# Patient Record
Sex: Female | Born: 1960
Health system: Southern US, Community
[De-identification: ages and names within clinical notes are randomized; demographics above are authoritative.]

## PROBLEM LIST (undated history)

## (undated) DIAGNOSIS — K589 Irritable bowel syndrome without diarrhea: Secondary | ICD-10-CM

## (undated) DIAGNOSIS — J309 Allergic rhinitis, unspecified: Secondary | ICD-10-CM

## (undated) DIAGNOSIS — I10 Essential (primary) hypertension: Secondary | ICD-10-CM

## (undated) DIAGNOSIS — C439 Malignant melanoma of skin, unspecified: Secondary | ICD-10-CM

## (undated) DIAGNOSIS — E785 Hyperlipidemia, unspecified: Secondary | ICD-10-CM

## (undated) DIAGNOSIS — F988 Other specified behavioral and emotional disorders with onset usually occurring in childhood and adolescence: Secondary | ICD-10-CM

## (undated) HISTORY — DX: Essential (primary) hypertension: I10

## (undated) HISTORY — DX: Other specified behavioral and emotional disorders with onset usually occurring in childhood and adolescence: F98.8

## (undated) HISTORY — DX: Hyperlipidemia, unspecified: E78.5

## (undated) HISTORY — DX: Irritable bowel syndrome, unspecified: K58.9

## (undated) HISTORY — PX: COLPOSCOPY: SHX161

## (undated) HISTORY — DX: Allergic rhinitis, unspecified: J30.9

## (undated) HISTORY — DX: Malignant melanoma of skin, unspecified: C43.9

---
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
## 1993-10-25 DIAGNOSIS — C439 Malignant melanoma of skin, unspecified: Secondary | ICD-10-CM

## 1993-10-25 HISTORY — DX: Malignant melanoma of skin, unspecified: C43.9

## 1998-01-16 ENCOUNTER — Ambulatory Visit (HOSPITAL_COMMUNITY): Admission: RE | Admit: 1998-01-16 | Discharge: 1998-01-16 | Payer: Self-pay | Admitting: Obstetrics and Gynecology

## 1998-07-21 ENCOUNTER — Inpatient Hospital Stay (HOSPITAL_COMMUNITY): Admission: AD | Admit: 1998-07-21 | Discharge: 1998-07-23 | Payer: Self-pay | Admitting: Obstetrics and Gynecology

## 1998-07-28 ENCOUNTER — Encounter (HOSPITAL_COMMUNITY): Admission: RE | Admit: 1998-07-28 | Discharge: 1998-10-26 | Payer: Self-pay | Admitting: Obstetrics and Gynecology

## 1998-10-30 ENCOUNTER — Encounter (HOSPITAL_COMMUNITY): Admission: RE | Admit: 1998-10-30 | Discharge: 1999-01-28 | Payer: Self-pay | Admitting: *Deleted

## 1999-02-06 ENCOUNTER — Ambulatory Visit (HOSPITAL_COMMUNITY): Admission: RE | Admit: 1999-02-06 | Discharge: 1999-02-06 | Payer: Self-pay | Admitting: Internal Medicine

## 1999-02-06 ENCOUNTER — Encounter: Payer: Self-pay | Admitting: Internal Medicine

## 1999-02-24 ENCOUNTER — Other Ambulatory Visit: Admission: RE | Admit: 1999-02-24 | Discharge: 1999-02-24 | Payer: Self-pay | Admitting: Obstetrics and Gynecology

## 1999-02-28 ENCOUNTER — Encounter (HOSPITAL_COMMUNITY): Admission: RE | Admit: 1999-02-28 | Discharge: 1999-05-29 | Payer: Self-pay | Admitting: *Deleted

## 1999-06-03 ENCOUNTER — Encounter (HOSPITAL_COMMUNITY): Admission: RE | Admit: 1999-06-03 | Discharge: 1999-09-01 | Payer: Self-pay | Admitting: *Deleted

## 2000-05-10 ENCOUNTER — Other Ambulatory Visit: Admission: RE | Admit: 2000-05-10 | Discharge: 2000-05-10 | Payer: Self-pay | Admitting: Obstetrics and Gynecology

## 2001-06-09 ENCOUNTER — Other Ambulatory Visit: Admission: RE | Admit: 2001-06-09 | Discharge: 2001-06-09 | Payer: Self-pay | Admitting: Obstetrics and Gynecology

## 2001-06-14 ENCOUNTER — Encounter: Admission: RE | Admit: 2001-06-14 | Discharge: 2001-06-14 | Payer: Self-pay | Admitting: Obstetrics and Gynecology

## 2001-06-14 ENCOUNTER — Encounter: Payer: Self-pay | Admitting: Obstetrics and Gynecology

## 2002-06-11 ENCOUNTER — Other Ambulatory Visit: Admission: RE | Admit: 2002-06-11 | Discharge: 2002-06-11 | Payer: Self-pay | Admitting: Obstetrics and Gynecology

## 2002-06-18 ENCOUNTER — Encounter: Payer: Self-pay | Admitting: Obstetrics and Gynecology

## 2002-06-18 ENCOUNTER — Encounter: Admission: RE | Admit: 2002-06-18 | Discharge: 2002-06-18 | Payer: Self-pay | Admitting: Obstetrics and Gynecology

## 2003-06-14 ENCOUNTER — Encounter: Payer: Self-pay | Admitting: Internal Medicine

## 2003-06-14 ENCOUNTER — Ambulatory Visit (HOSPITAL_COMMUNITY): Admission: RE | Admit: 2003-06-14 | Discharge: 2003-06-14 | Payer: Self-pay | Admitting: Internal Medicine

## 2003-06-17 ENCOUNTER — Other Ambulatory Visit: Admission: RE | Admit: 2003-06-17 | Discharge: 2003-06-17 | Payer: Self-pay | Admitting: Obstetrics and Gynecology

## 2003-09-24 ENCOUNTER — Encounter: Admission: RE | Admit: 2003-09-24 | Discharge: 2003-09-24 | Payer: Self-pay | Admitting: Obstetrics and Gynecology

## 2004-12-30 ENCOUNTER — Other Ambulatory Visit: Admission: RE | Admit: 2004-12-30 | Discharge: 2004-12-30 | Payer: Self-pay | Admitting: Obstetrics and Gynecology

## 2005-03-05 ENCOUNTER — Ambulatory Visit: Payer: Self-pay | Admitting: Internal Medicine

## 2005-03-09 ENCOUNTER — Encounter: Admission: RE | Admit: 2005-03-09 | Discharge: 2005-03-09 | Payer: Self-pay | Admitting: Obstetrics and Gynecology

## 2005-09-08 ENCOUNTER — Ambulatory Visit: Payer: Self-pay | Admitting: Internal Medicine

## 2005-11-20 ENCOUNTER — Ambulatory Visit: Payer: Self-pay | Admitting: Internal Medicine

## 2005-12-31 ENCOUNTER — Other Ambulatory Visit: Admission: RE | Admit: 2005-12-31 | Discharge: 2005-12-31 | Payer: Self-pay | Admitting: Obstetrics and Gynecology

## 2006-02-23 ENCOUNTER — Other Ambulatory Visit: Admission: RE | Admit: 2006-02-23 | Discharge: 2006-02-23 | Payer: Self-pay | Admitting: Obstetrics and Gynecology

## 2006-03-28 ENCOUNTER — Encounter: Admission: RE | Admit: 2006-03-28 | Discharge: 2006-03-28 | Payer: Self-pay | Admitting: Obstetrics and Gynecology

## 2006-07-04 ENCOUNTER — Ambulatory Visit: Payer: Self-pay | Admitting: Internal Medicine

## 2006-11-11 ENCOUNTER — Ambulatory Visit: Payer: Self-pay | Admitting: Licensed Clinical Social Worker

## 2006-11-15 ENCOUNTER — Ambulatory Visit: Payer: Self-pay | Admitting: Licensed Clinical Social Worker

## 2006-11-24 ENCOUNTER — Ambulatory Visit: Payer: Self-pay | Admitting: Licensed Clinical Social Worker

## 2006-12-21 ENCOUNTER — Ambulatory Visit: Payer: Self-pay | Admitting: Licensed Clinical Social Worker

## 2007-01-03 ENCOUNTER — Ambulatory Visit: Payer: Self-pay | Admitting: Licensed Clinical Social Worker

## 2007-01-26 ENCOUNTER — Ambulatory Visit: Payer: Self-pay | Admitting: Licensed Clinical Social Worker

## 2007-05-03 ENCOUNTER — Encounter: Admission: RE | Admit: 2007-05-03 | Discharge: 2007-05-03 | Payer: Self-pay | Admitting: Obstetrics and Gynecology

## 2007-05-24 ENCOUNTER — Ambulatory Visit: Payer: Self-pay | Admitting: Internal Medicine

## 2007-05-24 LAB — CONVERTED CEMR LAB
ALT: 18 units/L (ref 0–35)
AST: 25 units/L (ref 0–37)
Albumin: 4.3 g/dL (ref 3.5–5.2)
Alkaline Phosphatase: 73 units/L (ref 39–117)
BUN: 10 mg/dL (ref 6–23)
Basophils Absolute: 0.1 10*3/uL (ref 0.0–0.1)
Basophils Relative: 0.9 % (ref 0.0–1.0)
Bilirubin Urine: NEGATIVE
Bilirubin, Direct: 0.1 mg/dL (ref 0.0–0.3)
CO2: 31 meq/L (ref 19–32)
Calcium: 9.8 mg/dL (ref 8.4–10.5)
Chloride: 105 meq/L (ref 96–112)
Cholesterol: 226 mg/dL (ref 0–200)
Creatinine, Ser: 1 mg/dL (ref 0.4–1.2)
Direct LDL: 141.1 mg/dL
Eosinophils Absolute: 0.5 10*3/uL (ref 0.0–0.6)
Eosinophils Relative: 6.7 % — ABNORMAL HIGH (ref 0.0–5.0)
GFR calc Af Amer: 77 mL/min
GFR calc non Af Amer: 64 mL/min
Glucose, Bld: 103 mg/dL — ABNORMAL HIGH (ref 70–99)
HCT: 40.5 % (ref 36.0–46.0)
HDL: 53.2 mg/dL (ref >39.0)
Hemoglobin, Urine: NEGATIVE
Hemoglobin: 13.9 g/dL (ref 12.0–15.0)
Ketones, ur: NEGATIVE mg/dL
Leukocytes, UA: NEGATIVE
Lymphocytes Relative: 26.2 % (ref 12.0–46.0)
MCHC: 34.4 g/dL (ref 30.0–36.0)
MCV: 87.1 fL (ref 78.0–100.0)
Monocytes Absolute: 0.5 10*3/uL (ref 0.2–0.7)
Monocytes Relative: 7.2 % (ref 3.0–11.0)
Neutro Abs: 4.2 10*3/uL (ref 1.4–7.7)
Neutrophils Relative %: 59 % (ref 43.0–77.0)
Nitrite: NEGATIVE
Platelets: 233 10*3/uL (ref 150–400)
Potassium: 4 meq/L (ref 3.5–5.1)
RBC: 4.65 M/uL (ref 3.87–5.11)
RDW: 11.4 % — ABNORMAL LOW (ref 11.5–14.6)
Sodium: 142 meq/L (ref 135–145)
Specific Gravity, Urine: 1.01 (ref 1.000–1.03)
TSH: 2.14 microintl units/mL (ref 0.35–5.50)
Total Bilirubin: 0.9 mg/dL (ref 0.3–1.2)
Total CHOL/HDL Ratio: 4.2
Total Protein, Urine: NEGATIVE mg/dL
Total Protein: 7.4 g/dL (ref 6.0–8.3)
Triglycerides: 142 mg/dL (ref 0–149)
Urine Glucose: NEGATIVE mg/dL
Urobilinogen, UA: 0.2 (ref 0.0–1.0)
VLDL: 28 mg/dL (ref 0–40)
WBC: 7.2 10*3/uL (ref 4.5–10.5)
pH: 7 (ref 5.0–8.0)

## 2007-05-26 DIAGNOSIS — Z8619 Personal history of other infectious and parasitic diseases: Secondary | ICD-10-CM | POA: Insufficient documentation

## 2007-05-26 DIAGNOSIS — K589 Irritable bowel syndrome without diarrhea: Secondary | ICD-10-CM | POA: Insufficient documentation

## 2007-05-26 DIAGNOSIS — J309 Allergic rhinitis, unspecified: Secondary | ICD-10-CM | POA: Insufficient documentation

## 2007-05-26 DIAGNOSIS — K649 Unspecified hemorrhoids: Secondary | ICD-10-CM | POA: Insufficient documentation

## 2007-05-26 DIAGNOSIS — Z85820 Personal history of malignant melanoma of skin: Secondary | ICD-10-CM | POA: Insufficient documentation

## 2007-05-26 DIAGNOSIS — E785 Hyperlipidemia, unspecified: Secondary | ICD-10-CM | POA: Insufficient documentation

## 2007-05-26 DIAGNOSIS — K645 Perianal venous thrombosis: Secondary | ICD-10-CM | POA: Insufficient documentation

## 2007-11-11 ENCOUNTER — Emergency Department (HOSPITAL_COMMUNITY): Admission: EM | Admit: 2007-11-11 | Discharge: 2007-11-11 | Payer: Self-pay | Admitting: Emergency Medicine

## 2007-11-15 ENCOUNTER — Ambulatory Visit (HOSPITAL_BASED_OUTPATIENT_CLINIC_OR_DEPARTMENT_OTHER): Admission: RE | Admit: 2007-11-15 | Discharge: 2007-11-16 | Payer: Self-pay | Admitting: *Deleted

## 2008-03-25 ENCOUNTER — Encounter: Payer: Self-pay | Admitting: Internal Medicine

## 2008-09-12 ENCOUNTER — Encounter: Admission: RE | Admit: 2008-09-12 | Discharge: 2008-09-12 | Payer: Self-pay | Admitting: Obstetrics and Gynecology

## 2008-10-25 HISTORY — PX: WRIST SURGERY: SHX841

## 2008-11-27 ENCOUNTER — Telehealth: Payer: Self-pay | Admitting: Internal Medicine

## 2008-11-28 ENCOUNTER — Ambulatory Visit: Payer: Self-pay | Admitting: Internal Medicine

## 2009-05-06 ENCOUNTER — Encounter: Payer: Self-pay | Admitting: Internal Medicine

## 2009-05-06 DIAGNOSIS — F988 Other specified behavioral and emotional disorders with onset usually occurring in childhood and adolescence: Secondary | ICD-10-CM | POA: Insufficient documentation

## 2009-09-15 ENCOUNTER — Encounter: Admission: RE | Admit: 2009-09-15 | Discharge: 2009-09-15 | Payer: Self-pay | Admitting: Obstetrics and Gynecology

## 2009-09-24 ENCOUNTER — Encounter: Admission: RE | Admit: 2009-09-24 | Discharge: 2009-09-24 | Payer: Self-pay | Admitting: Obstetrics and Gynecology

## 2009-10-15 ENCOUNTER — Ambulatory Visit: Payer: Self-pay | Admitting: Internal Medicine

## 2010-09-10 ENCOUNTER — Ambulatory Visit: Payer: Self-pay | Admitting: Internal Medicine

## 2010-10-01 ENCOUNTER — Ambulatory Visit: Payer: Self-pay | Admitting: Internal Medicine

## 2010-10-06 ENCOUNTER — Telehealth: Payer: Self-pay | Admitting: Internal Medicine

## 2010-11-26 NOTE — Letter (Signed)
Summary: Out of Work  [facility]  [address] [place], [place] [address]   Phone: [phone]  Fax: [phone]    September 10, 2010   Employee:  Caitlyn Schmidt    To Whom It May Concern:   For Medical reasons, please excuse the above named employee from work for the following dates:  Start:   Sep 10, 2010  End:   Sep 13, 2010 - to return to work Sep 14 2010  If you need additional information, please feel free to contact our office.         Sincerely,    Corwin Levins MD

## 2010-11-26 NOTE — Assessment & Plan Note (Signed)
Summary: CONGESTION/ FEVER? /NWS   Vital Signs:  Patient profile:   50 year old female Height:      69 inches Weight:      152 pounds BMI:     22.53 O2 Sat:      96 % on Room air Temp:     100 degrees F oral Pulse rate:   81 / minute BP sitting:   102 / 64  (left arm) Cuff size:   regular  Vitals Entered By: Zella Ball Ewing CMA Duncan Dull) (September 10, 2010 1:41 PM)  O2 Flow:  Room air CC: Congestion, fever, body aches, headache/RE   CC:  Congestion, fever, body aches, and headache/RE.  History of Present Illness: here with acute 3 days fever and mod sinus pain, pressure, and greenish d/c with mild ST but Pt denies CP, worsening sob, doe, wheezing, orthopnea, pnd, worsening LE edema, palps, dizziness or syncope  Not tried any OTC meds.   Has a sick family member at home as well.   Preventive Screening-Counseling & Management      Drug Use:  no.    Problems Prior to Update: 1)  Preventive Health Care  (ICD-V70.0) 2)  Sinusitis- Acute-nos  (ICD-461.9) 3)  Add  (ICD-314.00) 4)  Palpitations  (ICD-785.1) 5)  Asthmatic Bronchitis, Acute  (ICD-466.0) 6)  Hx, Personal, Malignancy, Skin Melanoma  (ICD-V10.82) 7)  Anal Fissure, Hx of  (ICD-V13.3) 8)  Hemorrhoids  (ICD-455.6) 9)  Ibs  (ICD-564.1) 10)  Infectious Mononucleosis, Hx of  (ICD-V12.09) 11)  Hyperlipidemia  (ICD-272.4) 12)  Allergic Rhinitis  (ICD-477.9)  Medications Prior to Update: 1)  Calcium 2)  Viactiv Multi-Vitamin  Chew (Multiple Vitamins-Calcium) .Marland Kitchen.. 1 By Mouth Once Daily 3)  Fish Oil 1000 Mg Caps (Omega-3 Fatty Acids) .Marland Kitchen.. 1 By Mouth Once Daily 4)  Ginkoba 40 Mg Tabs (Ginkgo Biloba) .Marland Kitchen.. 1 By Mouth Once Daily 5)  St Johns Wort 300 Mg Caps ([address] Johns Wort) .Marland Kitchen.. 1 By Mouth Once Daily 6)  Cephalexin 500 Mg Caps (Cephalexin) .Marland Kitchen.. 1 By Mouth Three Times A Day 7)  Fexofenadine Hcl 180 Mg Tabs (Fexofenadine Hcl) .Marland Kitchen.. 1po Once Daily As Needed Allergies 8)  Fluticasone Propionate 50 Mcg/act Susp (Fluticasone Propionate)  .... 2 Spray/side Once Daily As Needed Allergies 9)  Ventolin Hfa 108 (90 Base) Mcg/act Aers (Albuterol Sulfate) .... 2 Puffs Four Times Per Day As Needed For Wheezing  Current Medications (verified): 1)  Calcium 2)  Viactiv Multi-Vitamin  Chew (Multiple Vitamins-Calcium) .Marland Kitchen.. 1 By Mouth Once Daily 3)  Levofloxacin 500 Mg Tabs (Levofloxacin) .Marland Kitchen.. 1po Once Daily 4)  Ventolin Hfa 108 (90 Base) Mcg/act Aers (Albuterol Sulfate) .... 2 Puffs Four Times Per Day As Needed For Wheezing  Allergies (verified): 1)  ! Prednisone  Past History:  Past Medical History: Last updated: 10/15/2009 H/O Mononucleosis H/O Anal Fissure H/O Melanoma 1995 IBS Hemorrhoids Allergic rhinitis Hyperlipidemia  Past Surgical History: Last updated: 05/26/2007 Denies surgical history  Social History: Last updated: 09/10/2010 Never Smoked Married 1 chilld Drug use-no  Risk Factors: Smoking Status: never (11/28/2008)  Social History: Never Smoked Married 1 chilld Drug use-no Drug Use:  no  Review of Systems       all otherwise negative per pt -    Physical Exam  General:  alert and well-developed.   Head:  normocephalic and atraumatic.   Eyes:  vision grossly intact, pupils equal, and pupils round.   Ears:  bilat tm's red, sinus tender bilat Nose:  nasal dischargemucosal pallor  and mucosal edema.   Mouth:  pharyngeal erythema and fair dentition.   Neck:  supple and cervical lymphadenopathy.   Lungs:  normal respiratory effort and normal breath sounds.   Heart:  normal rate and regular rhythm.   Extremities:  no edema, no erythema    Impression & Recommendations:  Problem # 1:  SINUSITIS- ACUTE-NOS (ICD-461.9)  The following medications were removed from the medication list:    Fluticasone Propionate 50 Mcg/act Susp (Fluticasone propionate) .Marland Kitchen... 2 spray/side once daily as needed allergies Her updated medication list for this problem includes:    Levofloxacin 500 Mg Tabs  (Levofloxacin) .Marland Kitchen... 1po once daily treat as above, f/u any worsening signs or symptoms   Complete Medication List: 1)  Calcium  2)  Viactiv Multi-vitamin Chew (Multiple vitamins-calcium) .Marland Kitchen.. 1 by mouth once daily 3)  Levofloxacin 500 Mg Tabs (Levofloxacin) .Marland Kitchen.. 1po once daily 4)  Ventolin Hfa 108 (90 Base) Mcg/act Aers (Albuterol sulfate) .... 2 puffs four times per day as needed for wheezing  Patient Instructions: 1)  Please take all new medications as prescribed 2)  Continue all previous medications as before this visit  3)  You can also use Mucinex OTC or it's generic for congestion  4)  Please schedule a follow-up appointment as needed. Prescriptions: LEVOFLOXACIN 500 MG TABS (LEVOFLOXACIN) 1po once daily  #10 x 0   Entered and Authorized by:   Corwin Levins MD   Signed by:   Corwin Levins MD on 09/10/2010   Method used:   Print then Give to Patient   RxID:   [id number]    Orders Added: 1)  Est. Patient Level III [14782]

## 2010-11-26 NOTE — Assessment & Plan Note (Signed)
Summary: COUGH--NOT FEELING WELL  STC   Vital Signs:  Patient profile:   50 year old female Height:      69.5 inches Weight:      150.13 pounds BMI:     21.93 O2 Sat:      98 % on Room air Temp:     97.7 degrees F oral Pulse rate:   66 / minute BP sitting:   98 / 62  (left arm) Cuff size:   regular  Vitals Entered By: Zella Ball Ewing CMA Duncan Dull) (October 01, 2010 3:51 PM)  O2 Flow:  Room air CC: Cough, fatigue, wheezing, chest heaviness/RE   CC:  Cough, fatigue, wheezing, and chest heaviness/RE.  History of Present Illness: here to f/u with acute;  did quite nicely with tx from last visit as documented, but unfortuantely her son became ill agin, after which she seemed to develop symptoms as well acutely in the last 4 days;  with fever, facial pain, pressure and greenish d/c, this time with marked fatigue , general weakness and malaise;  and also mild ST with slight prod cough but not sure color or sputum; Pt denies CP, orthopnea, pnd, worsening LE edema, palps, dizziness or syncope  , but has had mild chest tightness , hard to take deep breath, mild wheeze, sob/doe but not functionally limiting.  Walked in from parking lot today without signficant difficulty, more or less normal rate.  Has not had to use the inhaler in the past 2-3 days.   Pt denies new neuro symptoms such as headache, facial or extremity weakness .  overrall nasal allergy symtpoms have been well controlled on allegra alone, without signficant ongoing seasonal allergy congestion  Problems Prior to Update: 1)  Preventive Health Care  (ICD-V70.0) 2)  Sinusitis- Acute-nos  (ICD-461.9) 3)  Add  (ICD-314.00) 4)  Palpitations  (ICD-785.1) 5)  Asthmatic Bronchitis, Acute  (ICD-466.0) 6)  Hx, Personal, Malignancy, Skin Melanoma  (ICD-V10.82) 7)  Anal Fissure, Hx of  (ICD-V13.3) 8)  Hemorrhoids  (ICD-455.6) 9)  Ibs  (ICD-564.1) 10)  Infectious Mononucleosis, Hx of  (ICD-V12.09) 11)  Hyperlipidemia  (ICD-272.4) 12)  Allergic  Rhinitis  (ICD-477.9)  Medications Prior to Update: 1)  Calcium 2)  Viactiv Multi-Vitamin  Chew (Multiple Vitamins-Calcium) .Marland Kitchen.. 1 By Mouth Once Daily 3)  Levofloxacin 500 Mg Tabs (Levofloxacin) .Marland Kitchen.. 1po Once Daily 4)  Ventolin Hfa 108 (90 Base) Mcg/act Aers (Albuterol Sulfate) .... 2 Puffs Four Times Per Day As Needed For Wheezing  Current Medications (verified): 1)  Calcium 2)  Viactiv Multi-Vitamin  Chew (Multiple Vitamins-Calcium) .Marland Kitchen.. 1 By Mouth Once Daily 3)  Ventolin Hfa 108 (90 Base) Mcg/act Aers (Albuterol Sulfate) .... 2 Puffs Four Times Per Day As Needed For Wheezing 4)  Azithromycin 250 Mg Tabs (Azithromycin) .... 2po Qd For 1 Day, Then 1po Qd For 4days, Then Stop 5)  Prednisone 10 Mg Tabs (Prednisone) .... 3po Qd For 3days, Then 2po Qd For 3days, Then 1po Qd For 3days, Then Stop 6)  Tessalon Perles 100 Mg Caps (Benzonatate) .Marland Kitchen.. 1-2 By Mouth Three Times A Day As Needed Cough  Allergies (verified): 1)  ! Prednisone  Past History:  Past Medical History: Last updated: 10/15/2009 H/O Mononucleosis H/O Anal Fissure H/O Melanoma 1995 IBS Hemorrhoids Allergic rhinitis Hyperlipidemia  Past Surgical History: Last updated: 05/26/2007 Denies surgical history  Social History: Last updated: 09/10/2010 Never Smoked Married 1 chilld Drug use-no  Risk Factors: Smoking Status: never (11/28/2008)  Review of Systems  all otherwise negative per pt -    Physical Exam  General:  alert and well-developed.   Head:  normocephalic and atraumatic.   Eyes:  vision grossly intact, pupils equal, and pupils round.   Ears:  bilat tm's red, sinus tender bilat Nose:  nasal dischargemucosal pallor and mucosal edema.   Mouth:  pharyngeal erythema and fair dentition.   Neck:  supple and cervical lymphadenopathy.   Lungs:  normal respiratory effort and normal breath sounds.   Heart:  normal rate and regular rhythm.   Extremities:  no edema, no erythema    Impression &  Recommendations:  Problem # 1:  SINUSITIS- ACUTE-NOS (ICD-461.9) Assessment Deteriorated  The following medications were removed from the medication list:    Levofloxacin 500 Mg Tabs (Levofloxacin) .Marland Kitchen... 1po once daily Her updated medication list for this problem includes:    Azithromycin 250 Mg Tabs (Azithromycin) .Marland Kitchen... 2po qd for 1 day, then 1po qd for 4days, then stop    Tessalon Perles 100 Mg Caps (Benzonatate) .Marland Kitchen... 1-2 by mouth three times a day as needed cough treat as above, f/u any worsening signs or symptoms   Problem # 2:  WHEEZING (ICD-786.07) Assessment: Deteriorated midl, for predpack for home, likely related to #1, f/u any worsening symptoms, cont the inhaler as needed   Problem # 3:  ALLERGIC RHINITIS (ICD-477.9) stable overall by hx and exam, ok to continue meds/tx as is  - for OTC allegra as needed   Complete Medication List: 1)  Calcium  2)  Viactiv Multi-vitamin Chew (Multiple vitamins-calcium) .Marland Kitchen.. 1 by mouth once daily 3)  Ventolin Hfa 108 (90 Base) Mcg/act Aers (Albuterol sulfate) .... 2 puffs four times per day as needed for wheezing 4)  Azithromycin 250 Mg Tabs (Azithromycin) .... 2po qd for 1 day, then 1po qd for 4days, then stop 5)  Prednisone 10 Mg Tabs (Prednisone) .... 3po qd for 3days, then 2po qd for 3days, then 1po qd for 3days, then stop 6)  Tessalon Perles 100 Mg Caps (Benzonatate) .Marland Kitchen.. 1-2 by mouth three times a day as needed cough  Patient Instructions: 1)  Please take all new medications as prescribed 2)  Continue all previous medications as before this visit  3)  Please schedule a follow-up appointment as needed. Prescriptions: TESSALON PERLES 100 MG CAPS (BENZONATATE) 1-2 by mouth three times a day as needed cough  #60 x 0   Entered and Authorized by:   Corwin Levins MD   Signed by:   Corwin Levins MD on 10/01/2010   Method used:   Print then Give to Patient   RxID:   [id number] PREDNISONE 10 MG TABS (PREDNISONE) 3po qd for 3days, then  2po qd for 3days, then 1po qd for 3days, then stop  #18 x 0   Entered and Authorized by:   Corwin Levins MD   Signed by:   Corwin Levins MD on 10/01/2010   Method used:   Print then Give to Patient   RxID:   [id number] AZITHROMYCIN 250 MG TABS (AZITHROMYCIN) 2po qd for 1 day, then 1po qd for 4days, then stop  #6 x 1   Entered and Authorized by:   Corwin Levins MD   Signed by:   Corwin Levins MD on 10/01/2010   Method used:   Print then Give to Patient   RxID:   [id number]    Orders Added: 1)  Est. Patient Level IV [36644]

## 2010-11-26 NOTE — Progress Notes (Signed)
Summary: Caitlyn Schmidt  Phone Note Call from Patient Call back at Home Phone [phone]   Caller: Patient--[phone] cell Call For: Corwin Levins MD Summary of Call: Pt states the Z-pak has not worked, does not want a refill of that but would prefer a differant one. Please advise. Initial call taken by: Verdell Face,  October 06, 2010 9:48 AM    New/Updated Medications: CEPHALEXIN 500 MG CAPS (CEPHALEXIN) 1po three times a day Prescriptions: CEPHALEXIN 500 MG CAPS (CEPHALEXIN) 1po three times a day  #30 x 0   Entered and Authorized by:   Corwin Levins MD   Signed by:   Corwin Levins MD on 10/06/2010   Method used:   Print then Give to Patient   RxID:   [id number]  done hardcopy to LIM side B - dahlia Corwin Levins MD  October 06, 2010 1:02 PM   Pt informed, Rx faxed to [facility] on [address] [place], [place]  October 06, 2010 1:26 PM

## 2011-03-09 NOTE — Op Note (Signed)
NAMEAUSTINE, Caitlyn Schmidt               ACCOUNT NO.:  [id number]   MEDICAL RECORD NO.:  [id number]          PATIENT TYPE:  AMB   LOCATION:  DSC                          FACILITY:  [facility]   PHYSICIAN:  Tennis Must Meyerdierks, M.D.DATE OF BIRTH:  1961-07-30   DATE OF PROCEDURE:  11/15/2007  DATE OF DISCHARGE:                               OPERATIVE REPORT   PREOPERATIVE DIAGNOSIS:  Comminuted intra-articular fracture, right  distal radius.   POSTOPERATIVE DIAGNOSIS:  Comminuted intra-articular fracture, right  distal radius.   PROCEDURE:  Open reduction internal fixation, right distal radius, with  freeze-dried cadaver bone graft.   SURGEON:  Dr. Metro Kung.   ANESTHESIA:  Axillary block.   OPERATIVE FINDINGS:  The patient had an intra-articular component of the  fracture that was in the center of the joint.  The volar and dorsal  cortices were not significantly displaced or angulated.   PROCEDURE:  Under axillary block anesthesia with a tourniquet on the  right arm, the right hand was prepped and draped in usual fashion, and  after exsanguinating the limb, the tourniquet was inflated to 250 mmHg.  Finger trap traction was applied in the index, long and ring fingers  with 10 pounds across the end of the hand table longitudinally.  A  longitudinal incision was then made over the FCR tendon in the distal  forearm.  It was carried down to the FCR sheath.  Bleeding points were  coagulated.  Blunt dissection was carried through the FCR tendon sheath,  and the tendon was retracted ulnarly.  The floor of the sheath was then  incised with the scissors.  Blunt dissection was carried down to the FPL  muscle belly which was retracted ulnarly.  Weitlaner retractors were  inserted, and the pronator quadratus was divided sharply at its radial  attachment.  A Freer elevator was used to elevate the muscle off of the  bone.  The fracture site was then opened with a Therapist, nutritional, and  freeze-dried cadaver bone graft was inserted into the cancellus portion  of the bone.  The central portion of the joint surface was then elevated  with a Freer, and the bone graft was packed in fully.  X-rays were then  obtained showing good alignment of the joint surface.  An AO distal  radius plate was then used and was applied to the volar surface after  reducing the volar cortex.  The 2.7-mm screws were inserted proximally  and 2.0-mm pegs distally.  Four pegs were inserted and 3 screws were  inserted.  X-rays showed good alignment of the fracture, and under  fluoroscopy, there was no motion at the fracture site.  The wound was  then irrigated copiously with saline.  The pronator quadratus was  repaired with 4-0 Vicryl.  TLSO drain was inserted.  Subcutaneous tissue  was closed with 4-0 Vicryl.  The skin was closed with a 3-0 subcuticular  Prolene.  Steri-Strips were applied followed by sterile dressings and a  volar wrist splint.  The patient had the tourniquet released with good  circulation of the hand.  She went to  the recovery room awake in stable  and good condition.     Lowell Bouton, M.D.  Electronically Signed    EMM/MEDQ  D:  11/15/2007  T:  11/15/2007  Job:  [id number]

## 2011-07-16 LAB — POCT HEMOGLOBIN-HEMACUE: Hemoglobin: 13.6

## 2011-12-06 ENCOUNTER — Encounter: Payer: Self-pay | Admitting: Internal Medicine

## 2011-12-06 ENCOUNTER — Ambulatory Visit (INDEPENDENT_AMBULATORY_CARE_PROVIDER_SITE_OTHER): Payer: BC Managed Care – PPO | Admitting: Internal Medicine

## 2011-12-06 VITALS — BP 118/80 | HR 69 | Temp 97.6°F

## 2011-12-06 DIAGNOSIS — J0101 Acute recurrent maxillary sinusitis: Secondary | ICD-10-CM

## 2011-12-06 DIAGNOSIS — J0100 Acute maxillary sinusitis, unspecified: Secondary | ICD-10-CM

## 2011-12-06 DIAGNOSIS — J309 Allergic rhinitis, unspecified: Secondary | ICD-10-CM

## 2011-12-06 MED ORDER — LEVOFLOXACIN 500 MG PO TABS: 500.0000 mg | ORAL_TABLET | Freq: Every day | ORAL | Status: AC

## 2011-12-06 MED ORDER — MOMETASONE FUROATE 50 MCG/ACT NA SUSP: 2.0000 | Freq: Every day | NASAL | Status: DC

## 2011-12-06 NOTE — Patient Instructions (Signed)
It was good to see you today. Levaquin antibiotics - also start nasal steroid spray for sinus and allergy symptoms (use in addition to Allegra) - Your prescription(s) have been submitted to your pharmacy. Please take as directed and contact our office if you believe you are having problem(s) with the medication(s). we'll make referral to ENT specialist. Our office will contact you regarding appointment(s) once made.

## 2011-12-06 NOTE — Progress Notes (Signed)
  Subjective:    HPI  complains of sinus symptoms - affecting L side face Onset >1 week ago, progressive symptoms  associated with sore throat, mild headache, dental and jaw pain and low grade fever Also myalgias, sinus pressure and mild chest congestion No relief with OTC meds Precipitated by sick contacts  Past Medical History  Diagnosis Date  . ADD (attention deficit disorder)   . Hyperlipidemia   . Allergic rhinitis, mild   . IBS (irritable bowel syndrome)     Review of Systems Constitutional: No fever or night sweats, no unexpected weight change Pulmonary: No pleurisy or hemoptysis Cardiovascular: No chest pain or palpitations     Objective:   Physical Exam BP 118/80  Pulse 69  Temp(Src) 97.6 F (36.4 C) (Oral)  SpO2 96% GEN: mildly ill appearing and audible head congestion HENT: NCAT, mild sinus tenderness L side maxillary and frontal region, narrowed nares without discharge, oropharynx mild erythema, no exudate Eyes: Vision grossly intact, no conjunctivitis Lungs: Clear to auscultation without rhonchi or wheeze, no increased work of breathing Cardiovascular: Regular rate and rhythm, no bilateral edema      Assessment & Plan:  Maxillary sinus pressure - acute on chronic symptoms ?infection allergic rhinitis      Empiric antibiotics prescribed due to symptom duration greater than 7 days Nasal steroids - new prescriptions done Symptomatic care with Tylenol or Advil, hydration and rest -  Refer to ENT at pt request for same given recurrent symptoms

## 2011-12-07 ENCOUNTER — Ambulatory Visit: Payer: Self-pay | Admitting: Internal Medicine

## 2012-06-16 ENCOUNTER — Other Ambulatory Visit: Payer: Self-pay | Admitting: Obstetrics and Gynecology

## 2012-06-16 DIAGNOSIS — Z1231 Encounter for screening mammogram for malignant neoplasm of breast: Secondary | ICD-10-CM

## 2012-07-26 ENCOUNTER — Ambulatory Visit: Payer: Self-pay | Admitting: Obstetrics and Gynecology

## 2012-07-26 ENCOUNTER — Encounter: Payer: Self-pay | Admitting: Obstetrics and Gynecology

## 2012-07-26 ENCOUNTER — Ambulatory Visit (INDEPENDENT_AMBULATORY_CARE_PROVIDER_SITE_OTHER): Payer: No Typology Code available for payment source | Admitting: Obstetrics and Gynecology

## 2012-07-26 ENCOUNTER — Ambulatory Visit
Admission: RE | Admit: 2012-07-26 | Discharge: 2012-07-26 | Disposition: A | Discharge status: Home / Self Care | Payer: PRIVATE HEALTH INSURANCE | Source: Ambulatory Visit | Attending: Obstetrics and Gynecology | Admitting: Obstetrics and Gynecology

## 2012-07-26 VITALS — BP 112/72 | HR 70 | Ht 69.5 in | Wt 150.0 lb

## 2012-07-26 DIAGNOSIS — Z124 Encounter for screening for malignant neoplasm of cervix: Secondary | ICD-10-CM

## 2012-07-26 DIAGNOSIS — Z1231 Encounter for screening mammogram for malignant neoplasm of breast: Secondary | ICD-10-CM

## 2012-07-26 DIAGNOSIS — N950 Postmenopausal bleeding: Secondary | ICD-10-CM

## 2012-07-26 DIAGNOSIS — R32 Unspecified urinary incontinence: Secondary | ICD-10-CM

## 2012-07-26 LAB — POCT URINALYSIS DIPSTICK
Bilirubin, UA: NEGATIVE
Blood, UA: NEGATIVE
Glucose, UA: NEGATIVE
Protein, UA: NEGATIVE
pH, UA: 5

## 2012-07-26 NOTE — Progress Notes (Signed)
Subjective:    Caitlyn Schmidt is a 51 y.o. female G1P0 who presents for annual exam however, she has other concerns that she would like to address.  Will reschedule annual exam. The patient reports feeling more down than usual, along with fatigue and weight gain over the past three months.  Denies any major changes in routine, family or personal issues.   For the past six months she noticed that she  will leak urine with cough, laugh or sneeze  There is no leaking at any other time.  Denies urgency, dysuria, change in bowel movements or vaginitis symptoms.  Has on occasion passed a lot of urine which was embarrassing.  One month ago she noticed a bloody stain in her underwear stating this has happened one other time this year but not as much.  Admits to some discomfort (unable to describe it) in the pelvic area around that time. Lastly for the past 3 month .  She denies suicidal or homicidal ideations.    Review of Systems Gastrointestinal:No change in bowel habits, no abdominal pain, no rectal bleeding Genitourinary:negative for abnormal vaginal bleeding,  dysuria, frequency, hematuria,  or nocturia    Objective:     BP 112/72  Pulse 70  Ht 5' 9.5" (1.765 m)  Wt 150 lb (68.04 kg)  BMI 21.83 kg/m2  Breastfeeding? Unknown Weight:  Wt Readings from Last 1 Encounters:  07/26/12 150 lb (68.04 kg)   Body mass index is 21.83 kg/(m^2). General Appearance:  Well nourished in no acute distress but with flat affect  Psychiatric: Alert and oriented x 3   U/A-negative  Assessment:   Symptoms of SUI Post Menopausal Bleeding Weight Gain/Fatigue  H/O Melanoma Family History of Thyroid Disease Plan:   Patient had to leave before she was able to complete evaluation-will reschedule  Pelvic U/S to measure endometrial stripe    Reviewed Kegel exercises and PT of the pelvic floor  Reviewed management   TSH-pening  RTO-as scheduled or prn   Kalik Hoare,ELMIRAPA-C

## 2012-07-26 NOTE — Progress Notes (Signed)
Regular Periods: no Mammogram: yes  Monthly Breast Ex.: yes Exercise: yes  Tetanus < 10 years: no Seatbelts: yes  NI. Bladder Functn.: yes Abuse at home: no  Daily BM's: yes Stressful Work: yes  Healthy Diet: yes Sigmoid-Colonoscopy: 11 years ago  Calcium: yes Medical problems this year: incontinence and notice she feeling blue more than usual   LAST [id number]  Contraception: post menopausal  Mammogram:  2 years ago  PCP: DR. Oliver Barre  PMH: NO CHANGE  FMH: NO CHANGE  Last Bone Scan: NO   PT IS SEPARATED.

## 2012-07-26 NOTE — Patient Instructions (Addendum)
Urinary Incontinence Your doctor wants you to have this information about urinary incontinence. This is the inability to keep urine in your body until you decide to release it. CAUSES  Prostate gland enlargement is a common cause of urinary incontinence. But there are many different causes for losing urinary control. They include:  Medicines.  Infections.  Prostate problems.  Surgery.  Neurological diseases.  Emotional factors. DIAGNOSIS  Evaluating the cause of incontinence is important in choosing the best treatment. This may require:  An ultrasound exam.  Kidney and bladder X-rays.  Cystoscopy. This is an exam of the bladder using a narrow scope. TREATMENT  For incontinent patients, normal daily hygiene and using changing pads or adult diapers regularly will prevent offensive odors and skin damage from the moisture. Changing your medicines may help control incontinence. Your caregiver may prescribe some medicines to help you regain control. Avoid caffeine. It can over-stimulate the bladder. Use the bathroom regularly. Try about every 2 to 3 hours even if you do not feel the need. Take time to empty your bladder completely. After urinating, wait a minute. Then try to urinate again. External devices used to catch urine or an indwelling urine catheter (Foley catheter) may be needed as well. Some prostate gland problems require surgery to correct. Call your caregiver for more information. Document Released: 11/18/2004 Document Revised: 01/03/2012 Document Reviewed: 11/13/2008 Exit[employer] Patient Information 2013 [employer].  

## 2012-07-31 ENCOUNTER — Ambulatory Visit: Payer: Self-pay | Admitting: Obstetrics and Gynecology

## 2012-08-02 ENCOUNTER — Encounter: Payer: Self-pay | Admitting: Obstetrics and Gynecology

## 2012-08-02 ENCOUNTER — Other Ambulatory Visit: Payer: Self-pay

## 2012-08-07 ENCOUNTER — Ambulatory Visit (INDEPENDENT_AMBULATORY_CARE_PROVIDER_SITE_OTHER): Payer: No Typology Code available for payment source

## 2012-08-07 ENCOUNTER — Ambulatory Visit (INDEPENDENT_AMBULATORY_CARE_PROVIDER_SITE_OTHER): Payer: No Typology Code available for payment source | Admitting: Obstetrics and Gynecology

## 2012-08-07 ENCOUNTER — Encounter: Payer: Self-pay | Admitting: Obstetrics and Gynecology

## 2012-08-07 VITALS — BP 110/70 | HR 70 | Wt 154.0 lb

## 2012-08-07 DIAGNOSIS — N9489 Other specified conditions associated with female genital organs and menstrual cycle: Secondary | ICD-10-CM

## 2012-08-07 DIAGNOSIS — N950 Postmenopausal bleeding: Secondary | ICD-10-CM

## 2012-08-07 NOTE — Progress Notes (Signed)
51 YO seen 07/26/12 for annual exam and postmenopausal bleeding returns for ultrasound. Patient states that our office is no longer in network for her  insurance but she chose to pay for this ultrasound because she didn't want to delay follow up.     O:  Ultrasound: uterus-6.33 x 5.57 x 4.75 cm, endometrium-complex heterogeneous mass completely obscuring the endometrium measuring 3.5 x 2.7 x 3.4 cm       borders are not clear, as with fibroids though this could represent a degenerating fibroid however, hyperplasia/neopplasm cannot be ruled out; color flow is      unremarkable and ovaries appear normal  A:  Post-Menopausal Bleeding      Endometrial Mass  P; Reviewed ultrasound findings along with options: endometrial biopsy with resulting additional procedure      depending on the outcome or hysteroscopy D & C.  Patient prefers to forgo an endometrial biopsy, as     she has had one before, and proceed with any other diagnostic/management procedure Dr. Normand Sloop may     recommend.      Once Dr. Normand Sloop offers her recommendation and cost of that determined, patient may decide to set up     a payment plan to pay for the procedure or transfer to an in-network gynecologist-Dr. Algie Coffer at [facility];  Patient also needs a TSH       RTO- TBA  Jarious Lyon, PA-C

## 2012-08-11 ENCOUNTER — Telehealth: Payer: Self-pay | Admitting: Obstetrics and Gynecology

## 2012-08-11 NOTE — Telephone Encounter (Signed)
Call to patient with an estimate of "self pay" rate for M.D. to do hysteroscopy D & C (as recommended by Dr. Normand Sloop) and that price is $407.47, since our practice is not in her insurance's network.  Advised patient that the hospital OR, anesthesia, pathology and lab costs would depend on whether the hospital is in her insurance's network.  Due to multiple uncertainties and cost, patient states she'll have to transfer to [facility]. Caitlyn Schmidt .  Patient  to sign a release of information form to facilitate this process.  Caitlyn Drone, PA-C

## 2012-08-15 NOTE — Progress Notes (Signed)
I recommend the patient have an endometrial bx.  She can have a D&C hysteroscopy in the office or hospital

## 2013-04-19 ENCOUNTER — Encounter: Payer: Self-pay | Admitting: Internal Medicine

## 2013-04-19 ENCOUNTER — Ambulatory Visit (INDEPENDENT_AMBULATORY_CARE_PROVIDER_SITE_OTHER): Payer: PRIVATE HEALTH INSURANCE | Admitting: Internal Medicine

## 2013-04-19 VITALS — BP 94/70 | HR 80 | Temp 97.9°F | Ht 69.5 in | Wt 151.2 lb

## 2013-04-19 DIAGNOSIS — L609 Nail disorder, unspecified: Secondary | ICD-10-CM

## 2013-04-19 DIAGNOSIS — Z0000 Encounter for general adult medical examination without abnormal findings: Secondary | ICD-10-CM | POA: Insufficient documentation

## 2013-04-19 DIAGNOSIS — L608 Other nail disorders: Secondary | ICD-10-CM | POA: Insufficient documentation

## 2013-04-19 NOTE — Progress Notes (Signed)
Subjective:    Patient ID: Caitlyn Schmidt, female    DOB: 11-02-1960, 52 y.o.   MRN: [id number]  HPI  Here for wellness and f/u;  Overall doing ok;  Pt denies CP, worsening SOB, DOE, wheezing, orthopnea, PND, worsening LE edema, palpitations, dizziness or syncope.  Pt denies neurological change such as new headache, facial or extremity weakness.  Pt denies polydipsia, polyuria, or low sugar symptoms. Pt states overall good compliance with treatment and medications, good tolerability, and has been trying to follow lower cholesterol diet.  Pt denies worsening depressive symptoms, suicidal ideation or panic. No fever, night sweats, wt loss, loss of appetite, or other constitutional symptoms.  Pt states good ability with ADL's, has low fall risk, home safety reviewed and adequate, no other significant changes in hearing or vision, and only occasionally active with exercise.  Does have a dark area under the left great nail without known trauma, has hx of melanoma Past Medical History  Diagnosis Date  . ADD (attention deficit disorder)   . Hyperlipidemia   . Allergic rhinitis, mild   . IBS (irritable bowel syndrome)   . Melanoma 1995   Past Surgical History  Procedure Laterality Date  . Colposcopy    . Wrist surgery  2010    reports that she has never smoked. She has never used smokeless tobacco. She reports that  drinks alcohol. She reports that she does not use illicit drugs. family history includes Cancer in her maternal grandmother, mother, and paternal grandfather; Diabetes in her maternal grandfather and maternal grandmother; Gout in her maternal grandfather; Heart disease in her maternal grandfather, paternal grandfather, and paternal grandmother; Hyperlipidemia in her father; Hypertension in her father, maternal grandmother, and mother; Kidney disease in her maternal grandfather; Macular degeneration in her maternal grandmother; Osteoporosis in her maternal grandmother; and Thyroid disease in  her mother. Allergies  Allergen Reactions  . Prednisone     REACTION: Jittery   Current Outpatient Prescriptions on File Prior to Visit  Medication Sig Dispense Refill  . calcium carbonate (OS-CAL) 600 MG TABS Take 600 mg by mouth daily.      . Calcium-Vitamin D-Vitamin K (CALCIUM SOFT CHEWS PO) Take by mouth.      . cholecalciferol (VITAMIN D) 1000 UNITS tablet Take 1,000 Units by mouth daily.      . fexofenadine (ALLEGRA) 180 MG tablet Take 180 mg by mouth daily as needed.      Marland Kitchen KRILL OIL PO Take by mouth.      . Multiple Vitamin (MULTIVITAMIN) tablet Take 1 tablet by mouth daily.      . mometasone (NASONEX) 50 MCG/ACT nasal spray Place 2 sprays into the nose daily.  17 g  12   No current facility-administered medications on file prior to visit.   Review of Systems Constitutional: Negative for diaphoresis, activity change, appetite change or unexpected weight change.  HENT: Negative for hearing loss, ear pain, facial swelling, mouth sores and neck stiffness.   Eyes: Negative for pain, redness and visual disturbance.  Respiratory: Negative for shortness of breath and wheezing.   Cardiovascular: Negative for chest pain and palpitations.  Gastrointestinal: Negative for diarrhea, blood in stool, abdominal distention or other pain Genitourinary: Negative for hematuria, flank pain or change in urine volume.  Musculoskeletal: Negative for myalgias and joint swelling.  Skin: Negative for color change and wound.  Neurological: Negative for syncope and numbness. other than noted Hematological: Negative for adenopathy.  Psychiatric/Behavioral: Negative for hallucinations, self-injury, decreased concentration and  agitation.      Objective:   Physical Exam BP 94/70  Pulse 80  Temp(Src) 97.9 F (36.6 C) (Oral)  Ht 5' 9.5" (1.765 m)  Wt 151 lb 4 oz (68.607 kg)  BMI 22.02 kg/m2  SpO2 93% VS noted,  Constitutional: Pt is oriented to person, place, and time. Appears well-developed and  well-nourished.  Head: Normocephalic and atraumatic.  Right Ear: External ear normal.  Left Ear: External ear normal.  Nose: Nose normal.  Mouth/Throat: Oropharynx is clear and moist.  Eyes: Conjunctivae and EOM are normal. Pupils are equal, round, and reactive to light.  Neck: Normal range of motion. Neck supple. No JVD present. No tracheal deviation present.  Cardiovascular: Normal rate, regular rhythm, normal heart sounds and intact distal pulses.   Pulmonary/Chest: Effort normal and breath sounds normal.  Abdominal: Soft. Bowel sounds are normal. There is no tenderness. No HSM  Musculoskeletal: Normal range of motion. Exhibits no edema.  Lymphadenopathy:  Has no cervical adenopathy.  Neurological: Pt is alert and oriented to person, place, and time. Pt has normal reflexes. No cranial nerve deficit.  Skin: Skin is warm and dry. No rash noted. left great nail diffuse yellowed, but has approx 5 x 8 mm black subungual lesion  Psychiatric:  Has  normal mood and affect. Behavior is normal.     Assessment & Plan:

## 2013-04-19 NOTE — Patient Instructions (Signed)
Please continue all other medications as before, and refills have been done if requested. You will be contacted regarding the referral for: podiatry Please call or message me on MyChart if you would need this changed to Dermatology instead Please go to the LAB in the Basement (turn left off the elevator) for the tests to be done at your convenience You will be contacted by phone if any changes need to made immediately.  Otherwise, you will receive a letter about your results with an explanation  Please remember to sign up for My Chart if you have not done so, as this will be important to you in the future with finding out test results, communicating by private email, and scheduling acute appointments online when needed.  Please return in 1 year for your yearly visit, or sooner if needed

## 2013-04-19 NOTE — Assessment & Plan Note (Signed)
Cant r/o subungual melanoma though possibly hematoma; ok for podiatry referral, consider derm

## 2013-04-19 NOTE — Assessment & Plan Note (Signed)

## 2013-04-20 ENCOUNTER — Encounter: Payer: Self-pay | Admitting: Internal Medicine

## 2013-04-25 ENCOUNTER — Ambulatory Visit (INDEPENDENT_AMBULATORY_CARE_PROVIDER_SITE_OTHER): Payer: No Typology Code available for payment source | Admitting: Podiatry

## 2013-04-25 DIAGNOSIS — M21969 Unspecified acquired deformity of unspecified lower leg: Secondary | ICD-10-CM

## 2013-04-25 DIAGNOSIS — L608 Other nail disorders: Secondary | ICD-10-CM

## 2013-04-25 DIAGNOSIS — L609 Nail disorder, unspecified: Secondary | ICD-10-CM

## 2013-04-25 NOTE — Progress Notes (Signed)
Subjective: 52 year old female patient presents complaining of pain and discolored left great toe nail since December 2013.  The dark streak on the nail plate came in last 2 weeks.  Does walking exercise.   Review of Systems - General ROS: negative Ophthalmic ROS: Eyes are getting weak and start wearing reading glass. ENT ROS: negative Breast ROS: negative for breast lumps Respiratory ROS: no cough, shortness of breath, or wheezing Cardiovascular ROS: no chest pain or dyspnea on exertion Gastrointestinal ROS: no abdominal pain, change in bowel habits, or black or bloody stools Musculoskeletal ROS: negative Neurological ROS: negative Dermatological ROS: negative  Objective: All pedal pulses are palpable. Discolored, yellow brown and dark streak on left great toe nail at distal 2/3. Hypermobility of the first MCJ L>R. Enlarged bunion L>R. Retrograde microtrauma left great toe. Porokeratosis sub 2 bilateral.  Plan: Reviewed clinical findings. Dispensed Metatarsal binder x 1 to add stability of the first Metatarsocuneiform joint.  Debrided porokeratosis sub 2 bilateral. Patient is to wear well supported and secured sandals with toe open to prevent microtrauma to toe nail.  Need Orthotics.  Patient will return for Orthotics.

## 2013-07-10 ENCOUNTER — Encounter: Payer: PRIVATE HEALTH INSURANCE | Admitting: Internal Medicine

## 2013-07-19 ENCOUNTER — Encounter: Payer: PRIVATE HEALTH INSURANCE | Admitting: Internal Medicine

## 2014-03-01 ENCOUNTER — Other Ambulatory Visit: Payer: Self-pay

## 2014-03-01 DIAGNOSIS — Z1231 Encounter for screening mammogram for malignant neoplasm of breast: Secondary | ICD-10-CM

## 2014-03-15 ENCOUNTER — Ambulatory Visit
Admission: RE | Admit: 2014-03-15 | Discharge: 2014-03-15 | Disposition: A | Discharge status: Home / Self Care | Payer: BC Managed Care – PPO | Source: Ambulatory Visit

## 2014-03-15 DIAGNOSIS — Z1231 Encounter for screening mammogram for malignant neoplasm of breast: Secondary | ICD-10-CM

## 2014-08-26 ENCOUNTER — Encounter: Payer: Self-pay | Admitting: Internal Medicine

## 2014-12-25 ENCOUNTER — Ambulatory Visit (INDEPENDENT_AMBULATORY_CARE_PROVIDER_SITE_OTHER): Payer: BLUE CROSS/BLUE SHIELD | Admitting: Podiatry

## 2014-12-25 ENCOUNTER — Encounter: Payer: Self-pay | Admitting: Podiatry

## 2014-12-25 VITALS — BP 132/61 | HR 73 | Ht 69.5 in | Wt 150.0 lb

## 2014-12-25 DIAGNOSIS — L609 Nail disorder, unspecified: Secondary | ICD-10-CM

## 2014-12-25 DIAGNOSIS — L608 Other nail disorders: Secondary | ICD-10-CM

## 2014-12-25 DIAGNOSIS — M79675 Pain in left toe(s): Secondary | ICD-10-CM

## 2014-12-25 NOTE — Progress Notes (Signed)
54 year old female presents complaining of pain on left great toe. Nail is thick and discolored at distal 1/2. Proximal 1/2 nail plate is thin and healthy. Assessment: Painful hypertrophic nail left hallux needing trimming. Plan: Debrided affected nail.  Keep the nail short to promote new nail growth. Return as needed.

## 2014-12-25 NOTE — Patient Instructions (Signed)
Problem with left great toe nail. Debrided. Return as needed.

## 2015-06-04 ENCOUNTER — Encounter: Payer: Self-pay | Admitting: Internal Medicine

## 2015-06-04 ENCOUNTER — Other Ambulatory Visit (INDEPENDENT_AMBULATORY_CARE_PROVIDER_SITE_OTHER): Payer: BLUE CROSS/BLUE SHIELD

## 2015-06-04 ENCOUNTER — Ambulatory Visit (INDEPENDENT_AMBULATORY_CARE_PROVIDER_SITE_OTHER): Payer: BLUE CROSS/BLUE SHIELD | Admitting: Internal Medicine

## 2015-06-04 VITALS — BP 136/92 | HR 74 | Temp 98.3°F | Ht 69.0 in | Wt 162.0 lb

## 2015-06-04 DIAGNOSIS — Z23 Encounter for immunization: Secondary | ICD-10-CM | POA: Diagnosis not present

## 2015-06-04 DIAGNOSIS — Z0000 Encounter for general adult medical examination without abnormal findings: Secondary | ICD-10-CM

## 2015-06-04 DIAGNOSIS — E559 Vitamin D deficiency, unspecified: Secondary | ICD-10-CM

## 2015-06-04 DIAGNOSIS — I10 Essential (primary) hypertension: Secondary | ICD-10-CM | POA: Diagnosis not present

## 2015-06-04 HISTORY — DX: Essential (primary) hypertension: I10

## 2015-06-04 LAB — BASIC METABOLIC PANEL
BUN: 11 mg/dL (ref 6–23)
CHLORIDE: 107 meq/L (ref 96–112)
CO2: 27 mEq/L (ref 19–32)
CREATININE: 1.02 mg/dL (ref 0.40–1.20)
Calcium: 9.9 mg/dL (ref 8.4–10.5)
GFR: 60.22 mL/min (ref >60.00)
Glucose, Bld: 96 mg/dL (ref 70–99)
POTASSIUM: 4.2 meq/L (ref 3.5–5.1)
Sodium: 142 mEq/L (ref 135–145)

## 2015-06-04 LAB — URINALYSIS, ROUTINE W REFLEX MICROSCOPIC
Bilirubin Urine: NEGATIVE
Hgb urine dipstick: NEGATIVE
KETONES UR: NEGATIVE
Leukocytes, UA: NEGATIVE
Nitrite: NEGATIVE
PH: 6 (ref 5.0–8.0)
RBC / HPF: NONE SEEN (ref 0–?)
Specific Gravity, Urine: 1.025 (ref 1.000–1.030)
TOTAL PROTEIN, URINE-UPE24: NEGATIVE
Urine Glucose: NEGATIVE
Urobilinogen, UA: 0.2 (ref 0.0–1.0)

## 2015-06-04 LAB — CBC WITH DIFFERENTIAL/PLATELET
Basophils Absolute: 0 10*3/uL (ref 0.0–0.1)
Basophils Relative: 0.4 % (ref 0.0–3.0)
EOS ABS: 0.2 10*3/uL (ref 0.0–0.7)
Eosinophils Relative: 3.5 % (ref 0.0–5.0)
HCT: 39.8 % (ref 36.0–46.0)
Hemoglobin: 13.5 g/dL (ref 12.0–15.0)
LYMPHS PCT: 22.9 % (ref 12.0–46.0)
Lymphs Abs: 1.5 10*3/uL (ref 0.7–4.0)
MCHC: 33.8 g/dL (ref 30.0–36.0)
MCV: 87.7 fl (ref 78.0–100.0)
Monocytes Absolute: 0.5 10*3/uL (ref 0.1–1.0)
Monocytes Relative: 7.1 % (ref 3.0–12.0)
NEUTROS PCT: 66.1 % (ref 43.0–77.0)
Neutro Abs: 4.2 10*3/uL (ref 1.4–7.7)
PLATELETS: 233 10*3/uL (ref 150.0–400.0)
RBC: 4.54 Mil/uL (ref 3.87–5.11)
RDW: 12.6 % (ref 11.5–15.5)
WBC: 6.4 10*3/uL (ref 4.0–10.5)

## 2015-06-04 LAB — LIPID PANEL
CHOLESTEROL: 243 mg/dL — AB (ref 0–200)
HDL: 50.5 mg/dL (ref >39.00)
LDL CALC: 164 mg/dL — AB (ref 0–99)
NonHDL: 192.51
TRIGLYCERIDES: 143 mg/dL (ref 0.0–149.0)
Total CHOL/HDL Ratio: 5
VLDL: 28.6 mg/dL (ref 0.0–40.0)

## 2015-06-04 LAB — HEPATIC FUNCTION PANEL
ALK PHOS: 80 U/L (ref 39–117)
ALT: 14 U/L (ref 0–35)
AST: 19 U/L (ref 0–37)
Albumin: 4.1 g/dL (ref 3.5–5.2)
BILIRUBIN DIRECT: 0 mg/dL (ref 0.0–0.3)
Total Bilirubin: 0.4 mg/dL (ref 0.2–1.2)
Total Protein: 7 g/dL (ref 6.0–8.3)

## 2015-06-04 LAB — TSH: TSH: 2.4 u[IU]/mL (ref 0.35–4.50)

## 2015-06-04 LAB — VITAMIN D 25 HYDROXY (VIT D DEFICIENCY, FRACTURES): VITD: 44.08 ng/mL (ref 30.00–100.00)

## 2015-06-04 MED ORDER — HYDROCHLOROTHIAZIDE 12.5 MG PO CAPS: 12.5000 mg | ORAL_CAPSULE | Freq: Every day | ORAL | Status: DC

## 2015-06-04 NOTE — Progress Notes (Signed)
Subjective:    Patient ID: Caitlyn Schmidt, female    DOB: Dec 01, 1960, 54 y.o.   MRN: [id number]  HPI  Here for wellness and f/u;  Overall doing ok;  Pt denies Chest pain, worsening SOB, DOE, wheezing, orthopnea, PND, worsening LE edema, palpitations, dizziness or syncope.  Pt denies neurological change such as new headache, facial or extremity weakness.  Pt denies polydipsia, polyuria, or low sugar symptoms. Pt states overall good compliance with treatment and medications, good tolerability, and has been trying to follow appropriate diet.  Pt denies worsening depressive symptoms, suicidal ideation or panic. No fever, night sweats, wt loss, loss of appetite, or other constitutional symptoms.  Pt states good ability with ADL's, has low fall risk, home safety reviewed and adequate, no other significant changes in hearing or vision, and only occasionally active with exercise.  Has had several episodes HA and elev BP recently mild. 152/87, 144/80, 135 sbp several times.     BP Readings from Last 3 Encounters:  06/04/15 136/92  12/25/14 132/61  04/19/13 94/70   Wt Readings from Last 3 Encounters:  06/04/15 162 lb (73.483 kg)  12/25/14 150 lb (68.04 kg)  04/19/13 151 lb 4 oz (68.607 kg)  Due for lab work, GYn exam and mammogram sept.  Past Medical History  Diagnosis Date  . ADD (attention deficit disorder)   . Hyperlipidemia   . Allergic rhinitis, mild   . IBS (irritable bowel syndrome)   . Melanoma 1995   Past Surgical History  Procedure Laterality Date  . Colposcopy    . Wrist surgery  2010    reports that she has never smoked. She has never used smokeless tobacco. She reports that she drinks alcohol. She reports that she does not use illicit drugs. family history includes Cancer in her maternal grandmother, mother, and paternal grandfather; Diabetes in her maternal grandfather and maternal grandmother; Gout in her maternal grandfather; Heart disease in her maternal grandfather, paternal  grandfather, and paternal grandmother; Hyperlipidemia in her father; Hypertension in her father, maternal grandmother, and mother; Kidney disease in her maternal grandfather; Macular degeneration in her maternal grandmother; Osteoporosis in her maternal grandmother; Thyroid disease in her mother. Allergies  Allergen Reactions  . Prednisone     REACTION: Jittery   Current Outpatient Prescriptions on File Prior to Visit  Medication Sig Dispense Refill  . cholecalciferol (VITAMIN D) 1000 UNITS tablet Take 4,000 Units by mouth daily.     . fexofenadine (ALLEGRA) 180 MG tablet Take 180 mg by mouth daily as needed.    Marland Kitchen KRILL OIL PO Take by mouth.    . Multiple Vitamin (MULTIVITAMIN) tablet Take 1 tablet by mouth daily.    . calcium carbonate (OS-CAL) 600 MG TABS Take 600 mg by mouth daily.    . Calcium-Vitamin D-Vitamin K (CALCIUM SOFT CHEWS PO) Take by mouth.     No current facility-administered medications on file prior to visit.   Review of Systems Constitutional: Negative for increased diaphoresis, other activity, appetite or siginficant weight change other than noted HENT: Negative for worsening hearing loss, ear pain, facial swelling, mouth sores and neck stiffness.   Eyes: Negative for other worsening pain, redness or visual disturbance.  Respiratory: Negative for shortness of breath and wheezing  Cardiovascular: Negative for chest pain and palpitations.  Gastrointestinal: Negative for diarrhea, blood in stool, abdominal distention or other pain Genitourinary: Negative for hematuria, flank pain or change in urine volume.  Musculoskeletal: Negative for myalgias or other joint complaints.  Skin: Negative for color change and wound or drainage.  Neurological: Negative for syncope and numbness. other than noted Hematological: Negative for adenopathy. or other swelling Psychiatric/Behavioral: Negative for hallucinations, SI, self-injury, decreased concentration or other worsening agitation.       Objective:   Physical Exam BP 136/92 mmHg  Pulse 74  Temp(Src) 98.3 F (36.8 C) (Oral)  Ht 5\' 9"  (1.753 m)  Wt 162 lb (73.483 kg)  BMI 23.91 kg/m2  SpO2 98% VS noted,  Constitutional: Pt is oriented to person, place, and time. Appears well-developed and well-nourished, in no significant distress Head: Normocephalic and atraumatic.  Right Ear: External ear normal.  Left Ear: External ear normal.  Nose: Nose normal.  Mouth/Throat: Oropharynx is clear and moist.  Eyes: Conjunctivae and EOM are normal. Pupils are equal, round, and reactive to light.  Neck: Normal range of motion. Neck supple. No JVD present. No tracheal deviation present or significant neck LA or mass Cardiovascular: Normal rate, regular rhythm, normal heart sounds and intact distal pulses.   Pulmonary/Chest: Effort normal and breath sounds without rales or wheezing  Abdominal: Soft. Bowel sounds are normal. NT. No HSM  Musculoskeletal: Normal range of motion. Exhibits no edema.  Lymphadenopathy:  Has no cervical adenopathy.  Neurological: Pt is alert and oriented to person, place, and time. Pt has normal reflexes. No cranial nerve deficit. Motor grossly intact Skin: Skin is warm and dry. No rash noted.  Psychiatric:  Has normal mood and affect. Behavior is normal.     Assessment & Plan:

## 2015-06-04 NOTE — Assessment & Plan Note (Signed)
New onset, very mild, ? Wt vs other related, has occas feet sweling, ok to start low dose hct 12.5 qd, consider 5--10 lbs wt loss to see if BP improves,  to f/u any worsening symptoms or concerns

## 2015-06-04 NOTE — Assessment & Plan Note (Signed)

## 2015-06-04 NOTE — Progress Notes (Signed)
Pre visit review using our clinic review tool, if applicable. No additional management support is needed unless otherwise documented below in the visit note. 

## 2015-06-04 NOTE — Addendum Note (Signed)
Addended by: Lyman Bishop on: 06/04/2015 09:19 AM   Modules accepted: Orders

## 2015-06-04 NOTE — Patient Instructions (Addendum)
You had the Tdap (tetanus) shot today  Please take all new medication as prescribed - the fluid pill  Please continue all other medications as before, and refills have been done if requested.  Please have the pharmacy call with any other refills you may need.  Please continue your efforts at being more active, low cholesterol diet, and weight control.  You are otherwise up to date with prevention measures today.  Please keep your appointments with your specialists as you may have planned  Please go to the LAB in the Basement (turn left off the elevator) for the tests to be done today, including the Vit D  You will be contacted by phone if any changes need to be made immediately.  Otherwise, you will receive a letter about your results with an explanation, but please check with MyChart first.  Please remember to sign up for MyChart if you have not done so, as this will be important to you in the future with finding out test results, communicating by private email, and scheduling acute appointments online when needed.  Please return in 3 months, or sooner if needed

## 2015-09-02 ENCOUNTER — Ambulatory Visit: Payer: BLUE CROSS/BLUE SHIELD | Admitting: Internal Medicine

## 2016-08-04 DIAGNOSIS — Z85828 Personal history of other malignant neoplasm of skin: Secondary | ICD-10-CM | POA: Diagnosis not present

## 2016-08-04 DIAGNOSIS — L570 Actinic keratosis: Secondary | ICD-10-CM | POA: Diagnosis not present

## 2016-08-04 DIAGNOSIS — L821 Other seborrheic keratosis: Secondary | ICD-10-CM | POA: Diagnosis not present

## 2016-08-04 DIAGNOSIS — L814 Other melanin hyperpigmentation: Secondary | ICD-10-CM | POA: Diagnosis not present

## 2016-08-04 DIAGNOSIS — Z85820 Personal history of malignant melanoma of skin: Secondary | ICD-10-CM | POA: Diagnosis not present

## 2017-05-16 DIAGNOSIS — Z13220 Encounter for screening for lipoid disorders: Secondary | ICD-10-CM | POA: Diagnosis not present

## 2017-05-16 DIAGNOSIS — Z0000 Encounter for general adult medical examination without abnormal findings: Secondary | ICD-10-CM | POA: Diagnosis not present

## 2017-05-16 DIAGNOSIS — Z1231 Encounter for screening mammogram for malignant neoplasm of breast: Secondary | ICD-10-CM | POA: Diagnosis not present

## 2017-05-16 DIAGNOSIS — Z1329 Encounter for screening for other suspected endocrine disorder: Secondary | ICD-10-CM | POA: Diagnosis not present

## 2017-05-16 DIAGNOSIS — Z131 Encounter for screening for diabetes mellitus: Secondary | ICD-10-CM | POA: Diagnosis not present

## 2017-05-16 DIAGNOSIS — Z6821 Body mass index (BMI) 21.0-21.9, adult: Secondary | ICD-10-CM | POA: Diagnosis not present

## 2017-05-16 DIAGNOSIS — Z1151 Encounter for screening for human papillomavirus (HPV): Secondary | ICD-10-CM | POA: Diagnosis not present

## 2017-05-16 DIAGNOSIS — Z130 Encounter for screening for diseases of the blood and blood-forming organs and certain disorders involving the immune mechanism: Secondary | ICD-10-CM | POA: Diagnosis not present

## 2017-05-16 DIAGNOSIS — Z01419 Encounter for gynecological examination (general) (routine) without abnormal findings: Secondary | ICD-10-CM | POA: Diagnosis not present

## 2017-07-01 ENCOUNTER — Telehealth: Payer: Self-pay

## 2017-07-01 ENCOUNTER — Other Ambulatory Visit (INDEPENDENT_AMBULATORY_CARE_PROVIDER_SITE_OTHER): Payer: BLUE CROSS/BLUE SHIELD

## 2017-07-01 ENCOUNTER — Other Ambulatory Visit: Payer: Self-pay | Admitting: Internal Medicine

## 2017-07-01 ENCOUNTER — Ambulatory Visit (INDEPENDENT_AMBULATORY_CARE_PROVIDER_SITE_OTHER): Payer: BLUE CROSS/BLUE SHIELD | Admitting: Internal Medicine

## 2017-07-01 ENCOUNTER — Encounter: Payer: Self-pay | Admitting: Internal Medicine

## 2017-07-01 VITALS — BP 124/86 | HR 68 | Temp 98.0°F | Ht 69.0 in | Wt 157.0 lb

## 2017-07-01 DIAGNOSIS — I10 Essential (primary) hypertension: Secondary | ICD-10-CM

## 2017-07-01 DIAGNOSIS — Z0000 Encounter for general adult medical examination without abnormal findings: Secondary | ICD-10-CM | POA: Diagnosis not present

## 2017-07-01 DIAGNOSIS — Z114 Encounter for screening for human immunodeficiency virus [HIV]: Secondary | ICD-10-CM | POA: Diagnosis not present

## 2017-07-01 DIAGNOSIS — Z1159 Encounter for screening for other viral diseases: Secondary | ICD-10-CM

## 2017-07-01 DIAGNOSIS — Z23 Encounter for immunization: Secondary | ICD-10-CM

## 2017-07-01 DIAGNOSIS — E785 Hyperlipidemia, unspecified: Secondary | ICD-10-CM

## 2017-07-01 DIAGNOSIS — N182 Chronic kidney disease, stage 2 (mild): Secondary | ICD-10-CM | POA: Diagnosis not present

## 2017-07-01 LAB — URINALYSIS, ROUTINE W REFLEX MICROSCOPIC
BILIRUBIN URINE: NEGATIVE
HGB URINE DIPSTICK: NEGATIVE
Ketones, ur: NEGATIVE
LEUKOCYTES UA: NEGATIVE
NITRITE: NEGATIVE
RBC / HPF: NONE SEEN (ref 0–?)
Specific Gravity, Urine: <=1.005 — AB (ref 1.000–1.030)
TOTAL PROTEIN, URINE-UPE24: NEGATIVE
URINE GLUCOSE: NEGATIVE
UROBILINOGEN UA: 0.2 (ref 0.0–1.0)
WBC, UA: NONE SEEN (ref 0–?)
pH: 6 (ref 5.0–8.0)

## 2017-07-01 LAB — LIPID PANEL
CHOL/HDL RATIO: 5
CHOLESTEROL: 262 mg/dL — AB (ref 0–200)
HDL: 56.8 mg/dL (ref >39.00)
NONHDL: 205.49
TRIGLYCERIDES: 208 mg/dL — AB (ref 0.0–149.0)
VLDL: 41.6 mg/dL — ABNORMAL HIGH (ref 0.0–40.0)

## 2017-07-01 LAB — HEPATIC FUNCTION PANEL
ALT: 13 U/L (ref 0–35)
AST: 20 U/L (ref 0–37)
Albumin: 4.6 g/dL (ref 3.5–5.2)
Alkaline Phosphatase: 83 U/L (ref 39–117)
Bilirubin, Direct: 0.1 mg/dL (ref 0.0–0.3)
TOTAL PROTEIN: 7.2 g/dL (ref 6.0–8.3)
Total Bilirubin: 0.5 mg/dL (ref 0.2–1.2)

## 2017-07-01 LAB — CBC WITH DIFFERENTIAL/PLATELET
BASOS ABS: 0.1 10*3/uL (ref 0.0–0.1)
Basophils Relative: 0.8 % (ref 0.0–3.0)
EOS ABS: 0.2 10*3/uL (ref 0.0–0.7)
Eosinophils Relative: 2.6 % (ref 0.0–5.0)
HEMATOCRIT: 41.7 % (ref 36.0–46.0)
Hemoglobin: 13.6 g/dL (ref 12.0–15.0)
LYMPHS PCT: 19.7 % (ref 12.0–46.0)
Lymphs Abs: 1.5 10*3/uL (ref 0.7–4.0)
MCHC: 32.7 g/dL (ref 30.0–36.0)
MCV: 89.6 fl (ref 78.0–100.0)
MONO ABS: 0.6 10*3/uL (ref 0.1–1.0)
Monocytes Relative: 7.6 % (ref 3.0–12.0)
Neutro Abs: 5.2 10*3/uL (ref 1.4–7.7)
Neutrophils Relative %: 69.3 % (ref 43.0–77.0)
PLATELETS: 242 10*3/uL (ref 150.0–400.0)
RBC: 4.65 Mil/uL (ref 3.87–5.11)
RDW: 12.7 % (ref 11.5–15.5)
WBC: 7.6 10*3/uL (ref 4.0–10.5)

## 2017-07-01 LAB — LDL CHOLESTEROL, DIRECT: LDL DIRECT: 162 mg/dL

## 2017-07-01 LAB — BASIC METABOLIC PANEL
BUN: 12 mg/dL (ref 6–23)
CHLORIDE: 103 meq/L (ref 96–112)
CO2: 29 mEq/L (ref 19–32)
Calcium: 9.8 mg/dL (ref 8.4–10.5)
Creatinine, Ser: 0.99 mg/dL (ref 0.40–1.20)
GFR: 61.84 mL/min (ref >60.00)
GLUCOSE: 95 mg/dL (ref 70–99)
POTASSIUM: 4 meq/L (ref 3.5–5.1)
SODIUM: 139 meq/L (ref 135–145)

## 2017-07-01 LAB — TSH: TSH: 2.67 u[IU]/mL (ref 0.35–4.50)

## 2017-07-01 MED ORDER — ROSUVASTATIN CALCIUM 20 MG PO TABS: 20.0000 mg | ORAL_TABLET | Freq: Every day | ORAL | 3 refills | Status: DC

## 2017-07-01 NOTE — Assessment & Plan Note (Signed)
Lost wt with improvement; ok to cont off med for now

## 2017-07-01 NOTE — Telephone Encounter (Signed)
Pt has been informed and expressed understanding.  

## 2017-07-01 NOTE — Patient Instructions (Addendum)
You had the flu shot today  Please continue all other medications as before, and refills have been done if requested.  Please have the pharmacy call with any other refills you may need.  Please continue your efforts at being more active, low cholesterol diet, and weight control.  You are otherwise up to date with prevention measures today.  Please keep your appointments with your specialists as you may have planned  You will be contacted regarding the referral for: Kidney ultrasound, and Nephrology referral  Please go to the LAB in the Basement (turn left off the elevator) for the tests to be done today  You will be contacted by phone if any changes need to be made immediately.  Otherwise, you will receive a letter about your results with an explanation, but please check with MyChart first.  Please remember to sign up for MyChart if you have not done so, as this will be important to you in the future with finding out test results, communicating by private email, and scheduling acute appointments online when needed.  Please return in 1 year for your yearly visit, or sooner if needed, with Lab testing done 3-5 days before

## 2017-07-01 NOTE — Progress Notes (Signed)
Subjective:    Patient ID: Caitlyn Schmidt, female    DOB: 1961/10/14, 56 y.o.   MRN: [id number]  HPI  Here for wellness and f/u;  Overall doing ok;  Pt denies Chest pain, worsening SOB, DOE, wheezing, orthopnea, PND, worsening LE edema, palpitations, dizziness or syncope.  Pt denies neurological change such as new headache, facial or extremity weakness.  Pt denies polydipsia, polyuria, or low sugar symptoms. Pt states overall good compliance with treatment and medications, good tolerability, and has been trying to follow appropriate diet.  Pt denies worsening depressive symptoms, suicidal ideation or panic. No fever, night sweats, wt loss, loss of appetite, or other constitutional symptoms.  Pt states good ability with ADL's, has low fall risk, home safety reviewed and adequate, no other significant changes in hearing or vision. BP Readings from Last 3 Encounters:  07/01/17 124/86  06/04/15 (!) 136/92  12/25/14 132/61   Wt Readings from Last 3 Encounters:  07/01/17 157 lb (71.2 kg)  06/04/15 162 lb (73.5 kg)  12/25/14 150 lb (68 kg)  Also has labs per GYN recently with GFR 60, concerning for new onset CKD.  Pt has lost significant wt overall, now normal wt, ? With HTN more than recognized.  Past Medical History:  Diagnosis Date  . ADD (attention deficit disorder)   . Allergic rhinitis, mild   . Essential hypertension 06/04/2015  . Hyperlipidemia   . IBS (irritable bowel syndrome)   . Melanoma ([place]) 1995   Past Surgical History:  Procedure Laterality Date  . COLPOSCOPY    . WRIST SURGERY  2010    reports that she has never smoked. She has never used smokeless tobacco. She reports that she drinks alcohol. She reports that she does not use drugs. family history includes Cancer in her maternal grandmother, mother, and paternal grandfather; Diabetes in her maternal grandfather and maternal grandmother; Gout in her maternal grandfather; Heart disease in her maternal grandfather, paternal  grandfather, and paternal grandmother; Hyperlipidemia in her father; Hypertension in her father, maternal grandmother, and mother; Kidney disease in her maternal grandfather; Macular degeneration in her maternal grandmother; Osteoporosis in her maternal grandmother; Thyroid disease in her mother. Allergies  Allergen Reactions  . Prednisone     REACTION: Jittery   Current Outpatient Prescriptions on File Prior to Visit  Medication Sig Dispense Refill  . Calcium-Vitamin D-Vitamin K (CALCIUM SOFT CHEWS PO) Take by mouth.    . cholecalciferol (VITAMIN D) 1000 UNITS tablet Take 4,000 Units by mouth daily.     . fexofenadine (ALLEGRA) 180 MG tablet Take 180 mg by mouth daily as needed.    Marland Kitchen KRILL OIL PO Take by mouth.    . Multiple Vitamin (MULTIVITAMIN) tablet Take 1 tablet by mouth daily.     No current facility-administered medications on file prior to visit.    Review of Systems Constitutional: Negative for other unusual diaphoresis, sweats, appetite or weight changes HENT: Negative for other worsening hearing loss, ear pain, facial swelling, mouth sores or neck stiffness.   Eyes: Negative for other worsening pain, redness or other visual disturbance.  Respiratory: Negative for other stridor or swelling Cardiovascular: Negative for other palpitations or other chest pain  Gastrointestinal: Negative for worsening diarrhea or loose stools, blood in stool, distention or other pain Genitourinary: Negative for hematuria, flank pain or other change in urine volume.  Musculoskeletal: Negative for myalgias or other joint swelling.  Skin: Negative for other color change, or other wound or worsening drainage.  Neurological:  Negative for other syncope or numbness. Hematological: Negative for other adenopathy or swelling Psychiatric/Behavioral: Negative for hallucinations, other worsening agitation, SI, self-injury, or new decreased concentration All other system neg per pt    Objective:   Physical  Exam BP 124/86   Pulse 68   Temp 98 F (36.7 C) (Oral)   Ht 5\' 9"  (1.753 m)   Wt 157 lb (71.2 kg)   SpO2 99%   BMI 23.18 kg/m  VS noted,  Constitutional: Pt is oriented to person, place, and time. Appears well-developed and well-nourished, in no significant distress and comfortable Head: Normocephalic and atraumatic  Eyes: Conjunctivae and EOM are normal. Pupils are equal, round, and reactive to light Right Ear: External ear normal without discharge Left Ear: External ear normal without discharge Nose: Nose without discharge or deformity Mouth/Throat: Oropharynx is without other ulcerations and moist  Neck: Normal range of motion. Neck supple. No JVD present. No tracheal deviation present or significant neck LA or mass Cardiovascular: Normal rate, regular rhythm, normal heart sounds and intact distal pulses.   Pulmonary/Chest: WOB normal and breath sounds without rales or wheezing  Abdominal: Soft. Bowel sounds are normal. NT. No HSM  Musculoskeletal: Normal range of motion. Exhibits no edema Lymphadenopathy: Has no other cervical adenopathy.  Neurological: Pt is alert and oriented to person, place, and time. Pt has normal reflexes. No cranial nerve deficit. Motor grossly intact, Gait intact Skin: Skin is warm and dry. No rash noted or new ulcerations Psychiatric:  Has normal mood and affect. Behavior is normal without agitation No other exam findings      Assessment & Plan:

## 2017-07-01 NOTE — Telephone Encounter (Signed)
-----   Message from Biagio Borg, MD sent at 07/01/2017  3:21 PM EDT ----- Letter sent, cont same tx except  The test results show that your current treatment is OK, as the kidney function is stable, and may be slightly improved over your last with GYN.  The LDL cholesterol is elevated, however.  Please start Crestor 20 mg daily as we discussed at your visit.    Neida Ellegood to please inform pt, I will do rx

## 2017-07-02 LAB — HEPATITIS C ANTIBODY
HEP C AB: NONREACTIVE
SIGNAL TO CUT-OFF: 0.01 (ref <1.00)

## 2017-07-02 LAB — HIV ANTIBODY (ROUTINE TESTING W REFLEX): HIV 1&2 Ab, 4th Generation: NONREACTIVE

## 2017-07-02 NOTE — Assessment & Plan Note (Signed)

## 2017-07-02 NOTE — Assessment & Plan Note (Addendum)
Recent onset, pt requests renal referral, also for renal u/s and UA with labs  In addition to the time spent performing CPE, I spent an additional 15 minutes face to face,in which greater than 50% of this time was spent in counseling and coordination of care for patient's illness as documented, including the differential dx, tx, further evaluation and other management of CKD, HTN, and HLD

## 2017-07-02 NOTE — Assessment & Plan Note (Signed)
Mild to mod in past, consider statin start with goal < 70 related to CKD,  to f/u any worsening symptoms or concerns

## 2017-07-19 ENCOUNTER — Ambulatory Visit
Admission: RE | Admit: 2017-07-19 | Discharge: 2017-07-19 | Disposition: A | Discharge status: Home / Self Care | Payer: BLUE CROSS/BLUE SHIELD | Source: Ambulatory Visit | Attending: Internal Medicine | Admitting: Internal Medicine

## 2017-07-19 DIAGNOSIS — N182 Chronic kidney disease, stage 2 (mild): Secondary | ICD-10-CM

## 2017-07-19 DIAGNOSIS — N183 Chronic kidney disease, stage 3 (moderate): Secondary | ICD-10-CM | POA: Diagnosis not present

## 2017-07-20 ENCOUNTER — Telehealth: Payer: Self-pay

## 2017-07-20 ENCOUNTER — Encounter: Payer: Self-pay | Admitting: Internal Medicine

## 2017-07-20 ENCOUNTER — Telehealth: Payer: Self-pay | Admitting: Internal Medicine

## 2017-07-20 DIAGNOSIS — N2889 Other specified disorders of kidney and ureter: Secondary | ICD-10-CM

## 2017-07-20 NOTE — Telephone Encounter (Signed)
Pt has been informed and expressed understanding.  

## 2017-07-20 NOTE — Telephone Encounter (Signed)
ok 

## 2017-07-20 NOTE — Telephone Encounter (Signed)
-----   Message from Biagio Borg, MD sent at 07/20/2017  1:04 PM EDT ----- Letter sent, cont same tx except  The test results show that your current treatment is OK, except there is "spot" to the lower end of the left kidney.  They were not able to say that this is a simple cyst or not, which is unfortunate as simple cysts do not require further treatment or evaluation.  To better evaluate this, we should ask you to go a step further with a renal MRI which can definitely characterize the spot.  If it is a simple cyst, this would not need further treatment.  If there is anything "solid" to it, we would need to refer you to Urology for further consideration.  Remember, you have also been referred to Nephrology who are the medical kidney doctors, but this would be different from Urology.  I will place the order, and you should hear from the office soon.  Shirron to please inform pt, I will do order for Renal MRI to determine if the "spot" is a cyst or not

## 2017-07-25 ENCOUNTER — Telehealth: Payer: Self-pay | Admitting: Internal Medicine

## 2017-07-25 DIAGNOSIS — N2889 Other specified disorders of kidney and ureter: Secondary | ICD-10-CM

## 2017-07-25 NOTE — Telephone Encounter (Signed)
Received fax from [facility] regarding nephrology referral. Dr. Moshe Cipro suggests pt see urologist for the cyst. Please advise

## 2017-07-25 NOTE — Telephone Encounter (Signed)
I was waiting for MRI abd  Will go ahead and refer - done

## 2017-07-26 NOTE — Telephone Encounter (Signed)
Urology referral sent to [facility]

## 2017-08-04 ENCOUNTER — Other Ambulatory Visit: Payer: BLUE CROSS/BLUE SHIELD

## 2017-08-06 ENCOUNTER — Ambulatory Visit
Admission: RE | Admit: 2017-08-06 | Discharge: 2017-08-06 | Disposition: A | Discharge status: Home / Self Care | Payer: BLUE CROSS/BLUE SHIELD | Source: Ambulatory Visit | Attending: Internal Medicine | Admitting: Internal Medicine

## 2017-08-06 DIAGNOSIS — N2889 Other specified disorders of kidney and ureter: Secondary | ICD-10-CM

## 2017-08-06 DIAGNOSIS — N281 Cyst of kidney, acquired: Secondary | ICD-10-CM | POA: Diagnosis not present

## 2017-08-08 ENCOUNTER — Encounter: Payer: Self-pay | Admitting: Internal Medicine

## 2017-08-11 ENCOUNTER — Ambulatory Visit (INDEPENDENT_AMBULATORY_CARE_PROVIDER_SITE_OTHER): Payer: BLUE CROSS/BLUE SHIELD | Admitting: Nurse Practitioner

## 2017-08-11 ENCOUNTER — Encounter: Payer: Self-pay | Admitting: Nurse Practitioner

## 2017-08-11 VITALS — BP 130/86 | HR 65 | Temp 97.9°F | Ht 69.0 in | Wt 154.0 lb

## 2017-08-11 DIAGNOSIS — R0981 Nasal congestion: Secondary | ICD-10-CM | POA: Diagnosis not present

## 2017-08-11 MED ORDER — OXYMETAZOLINE HCL 0.05 % NA SOLN: 1.0000 | Freq: Two times a day (BID) | NASAL | 0 refills | Status: DC

## 2017-08-11 MED ORDER — FLUTICASONE PROPIONATE 50 MCG/ACT NA SUSP: 2.0000 | Freq: Every day | NASAL | 0 refills | Status: DC

## 2017-08-11 MED ORDER — PSEUDOEPHEDRINE-APAP-DM 30-500-15 MG PO TABS: 1.0000 | ORAL_TABLET | Freq: Two times a day (BID) | ORAL | 0 refills | Status: AC

## 2017-08-11 NOTE — Patient Instructions (Addendum)
URI Instructions: Flonase and Afrin use: apply 1spray of afrin in each nare, wait 40mins, then apply 2sprays of flonase in each nare. Use both nasal spray consecutively x 3days, then flonase only for at least 7days.  Encourage adequate oral hydration.  Please call office if no improvement in 3days.  Consider oral abx and head/sinus CT if no improvement.

## 2017-08-11 NOTE — Progress Notes (Signed)
Subjective:  Patient ID: Caitlyn Schmidt, female    DOB: 1961-01-27  Age: 56 y.o. MRN: [id number]  CC: Fatigue (coughing,weak,light headed, presurre in face going on for 2 wks. )   Sinus Problem  This is a new problem. The current episode started 1 to 4 weeks ago. The problem is unchanged. There has been no fever. Associated symptoms include congestion, coughing, headaches and sinus pressure. Pertinent negatives include no chills, diaphoresis, hoarse voice, neck pain, shortness of breath, sneezing, sore throat or swollen glands. Past treatments include nothing.   No change in vision, no change in hearing, no tinnitus, no numbness or tingling, no weakness.  Outpatient Medications Prior to Visit  Medication Sig Dispense Refill  . Calcium-Vitamin D-Vitamin K (CALCIUM SOFT CHEWS PO) Take by mouth.    . cholecalciferol (VITAMIN D) 1000 UNITS tablet Take 4,000 Units by mouth daily.     . fexofenadine (ALLEGRA) 180 MG tablet Take 180 mg by mouth daily as needed.    Marland Kitchen KRILL OIL PO Take by mouth.    . Multiple Vitamin (MULTIVITAMIN) tablet Take 1 tablet by mouth daily.    . rosuvastatin (CRESTOR) 20 MG tablet Take 1 tablet (20 mg total) by mouth daily. 90 tablet 3   No facility-administered medications prior to visit.     ROS See HPI  Objective:  BP 130/86   Pulse 65   Temp 97.9 F (36.6 C)   Ht 5\' 9"  (1.753 m)   Wt 154 lb (69.9 kg)   SpO2 98%   BMI 22.74 kg/m   BP Readings from Last 3 Encounters:  08/11/17 130/86  07/01/17 124/86  06/04/15 (!) 136/92    Wt Readings from Last 3 Encounters:  08/11/17 154 lb (69.9 kg)  07/01/17 157 lb (71.2 kg)  06/04/15 162 lb (73.5 kg)    Physical Exam  Constitutional: She is oriented to person, place, and time. No distress.  HENT:  Right Ear: Tympanic membrane, external ear and ear canal normal. No middle ear effusion.  Left Ear: Tympanic membrane, external ear and ear canal normal.  No middle ear effusion.  Nose: Mucosal edema and  rhinorrhea present. Right sinus exhibits maxillary sinus tenderness and frontal sinus tenderness. Left sinus exhibits maxillary sinus tenderness and frontal sinus tenderness.  Mouth/Throat: Uvula is midline and oropharynx is clear and moist.  Neck: No JVD present.  Cardiovascular: Normal rate and regular rhythm.   Pulmonary/Chest: Effort normal and breath sounds normal.  Musculoskeletal: She exhibits no edema.  Lymphadenopathy:    She has no cervical adenopathy.  Neurological: She is alert and oriented to person, place, and time.  Vitals reviewed.   Lab Results  Component Value Date   WBC 7.6 07/01/2017   HGB 13.6 07/01/2017   HCT 41.7 07/01/2017   PLT 242.0 07/01/2017   GLUCOSE 95 07/01/2017   CHOL 262 (H) 07/01/2017   TRIG 208.0 (H) 07/01/2017   HDL 56.80 07/01/2017   LDLDIRECT 162.0 07/01/2017   LDLCALC 164 (H) 06/04/2015   ALT 13 07/01/2017   AST 20 07/01/2017   NA 139 07/01/2017   K 4.0 07/01/2017   CL 103 07/01/2017   CREATININE 0.99 07/01/2017   BUN 12 07/01/2017   CO2 29 07/01/2017   TSH 2.67 07/01/2017    Mr Abdomen Wo Contrast  Result Date: 08/06/2017 CLINICAL DATA:  Evaluate left renal mass on ultrasound EXAM: MRI ABDOMEN WITHOUT CONTRAST TECHNIQUE: Multiplanar multisequence MR imaging was performed without the administration of intravenous contrast. COMPARISON:  Renal ultrasound dated 07/19/2017 FINDINGS: Lower chest: Lung bases are clear. Hepatobiliary: Liver is within normal limits.  No hepatic steatosis. Gallbladder is unremarkable. No intrahepatic extrahepatic ductal dilatation. Pancreas:  Within normal limits. Spleen:  Within normal limits. Adrenals/Urinary Tract:  Adrenal glands are within normal limits. 1.9 x 2.1 cm left lower pole renal cyst (series 7/ image 21) with layering hemorrhage (series 8/ image 25), corresponding to the sonographic abnormality. While technically incompletely evaluated in the absence of intravenous contrast, this appearance strongly  favors a benign hemorrhagic cyst. Right kidney is notable for a 3 mm hemorrhagic cyst along the medial right upper kidney (series 10/ image 31). No hydronephrosis. Stomach/Bowel: Stomach is within normal limits. Visualized bowel is unremarkable. Vascular/Lymphatic:  No evidence of abdominal aortic aneurysm. No suspicious abdominal lymphadenopathy. Other:  No abdominal ascites. Musculoskeletal: No focal osseous lesions. IMPRESSION: 2.1 cm left lower pole renal cyst with layering hemorrhage, corresponding to the sonographic abnormality, strongly favoring a benign hemorrhagic cyst. Additional 3 mm hemorrhagic cyst along the medial right upper kidney. Electronically Signed   By: Julian Hy M.D.   On: 08/06/2017 14:02    Assessment & Plan:   Caitlyn Schmidt was seen today for fatigue.  Diagnoses and all orders for this visit:  Sinus congestion -     fluticasone (FLONASE) 50 MCG/ACT nasal spray; Place 2 sprays into both nostrils daily. -     oxymetazoline (AFRIN NASAL SPRAY) 0.05 % nasal spray; Place 1 spray into both nostrils 2 (two) times daily. Use only for 3days, then stop -     Pseudoephedrine-APAP-DM 30-500-15 MG TABS; Take 1 tablet by mouth every 12 (twelve) hours. X 2days, then stop   I am having Caitlyn Schmidt start on fluticasone, oxymetazoline, and Pseudoephedrine-APAP-DM. I am also having her maintain her multivitamin, cholecalciferol, fexofenadine, KRILL OIL PO, Calcium-Vitamin D-Vitamin K (CALCIUM SOFT CHEWS PO), and rosuvastatin.  Meds ordered this encounter  Medications  . fluticasone (FLONASE) 50 MCG/ACT nasal spray    Sig: Place 2 sprays into both nostrils daily.    Dispense:  16 g    Refill:  0    Order Specific Question:   Supervising Provider    Answer:   Binnie Rail [[id number]]  . oxymetazoline (AFRIN NASAL SPRAY) 0.05 % nasal spray    Sig: Place 1 spray into both nostrils 2 (two) times daily. Use only for 3days, then stop    Dispense:  30 mL    Refill:  0    Order Specific  Question:   Supervising Provider    Answer:   Binnie Rail [[id number]]  . Pseudoephedrine-APAP-DM 30-500-15 MG TABS    Sig: Take 1 tablet by mouth every 12 (twelve) hours. X 2days, then stop    Dispense:  14 each    Refill:  0    Order Specific Question:   Supervising Provider    Answer:   Binnie Rail [[id number]]    Follow-up: Return if symptoms worsen or fail to improve.  Wilfred Lacy, NP

## 2017-08-12 ENCOUNTER — Telehealth: Payer: Self-pay | Admitting: Internal Medicine

## 2017-08-12 NOTE — Telephone Encounter (Signed)
Patient called in to get MRI results.  Gave patient Dr. Judi Cong response.  Patient states that Dr. Jenny Reichmann referred her to [facility] and to [facility].  Would like to know if Dr. Jenny Reichmann wants her to keep appointments at both facilities or just one?

## 2017-08-12 NOTE — Telephone Encounter (Signed)
Both, as urology involves a Surgical evaluation (which is the considering of need for surgury) and the Renal appt is the medical doctor appt for the actual kidney function

## 2017-08-12 NOTE — Telephone Encounter (Signed)
Pt notified and expressed understanding

## 2017-08-15 DIAGNOSIS — L218 Other seborrheic dermatitis: Secondary | ICD-10-CM | POA: Diagnosis not present

## 2017-08-15 DIAGNOSIS — D225 Melanocytic nevi of trunk: Secondary | ICD-10-CM | POA: Diagnosis not present

## 2017-08-15 DIAGNOSIS — L821 Other seborrheic keratosis: Secondary | ICD-10-CM | POA: Diagnosis not present

## 2017-08-15 DIAGNOSIS — L814 Other melanin hyperpigmentation: Secondary | ICD-10-CM | POA: Diagnosis not present

## 2017-08-24 DIAGNOSIS — N393 Stress incontinence (female) (male): Secondary | ICD-10-CM | POA: Diagnosis not present

## 2017-08-24 DIAGNOSIS — N281 Cyst of kidney, acquired: Secondary | ICD-10-CM | POA: Diagnosis not present

## 2017-09-08 DIAGNOSIS — D631 Anemia in chronic kidney disease: Secondary | ICD-10-CM | POA: Diagnosis not present

## 2017-09-08 DIAGNOSIS — I129 Hypertensive chronic kidney disease with stage 1 through stage 4 chronic kidney disease, or unspecified chronic kidney disease: Secondary | ICD-10-CM | POA: Diagnosis not present

## 2017-09-08 DIAGNOSIS — N182 Chronic kidney disease, stage 2 (mild): Secondary | ICD-10-CM | POA: Diagnosis not present

## 2018-01-06 ENCOUNTER — Other Ambulatory Visit: Payer: Self-pay | Admitting: Urology

## 2018-01-06 DIAGNOSIS — D3000 Benign neoplasm of unspecified kidney: Secondary | ICD-10-CM

## 2018-02-20 ENCOUNTER — Encounter: Payer: Self-pay | Admitting: Internal Medicine

## 2018-02-20 ENCOUNTER — Other Ambulatory Visit: Payer: Self-pay | Admitting: Internal Medicine

## 2018-02-20 ENCOUNTER — Other Ambulatory Visit (INDEPENDENT_AMBULATORY_CARE_PROVIDER_SITE_OTHER): Payer: BLUE CROSS/BLUE SHIELD

## 2018-02-20 ENCOUNTER — Ambulatory Visit (INDEPENDENT_AMBULATORY_CARE_PROVIDER_SITE_OTHER)
Admission: RE | Admit: 2018-02-20 | Discharge: 2018-02-20 | Disposition: A | Discharge status: Home / Self Care | Payer: BLUE CROSS/BLUE SHIELD | Source: Ambulatory Visit | Attending: Internal Medicine | Admitting: Internal Medicine

## 2018-02-20 ENCOUNTER — Ambulatory Visit (INDEPENDENT_AMBULATORY_CARE_PROVIDER_SITE_OTHER): Payer: BLUE CROSS/BLUE SHIELD | Admitting: Internal Medicine

## 2018-02-20 VITALS — BP 104/68 | HR 73 | Temp 98.1°F | Ht 69.0 in | Wt 155.0 lb

## 2018-02-20 DIAGNOSIS — J309 Allergic rhinitis, unspecified: Secondary | ICD-10-CM | POA: Diagnosis not present

## 2018-02-20 DIAGNOSIS — R079 Chest pain, unspecified: Secondary | ICD-10-CM

## 2018-02-20 DIAGNOSIS — R0600 Dyspnea, unspecified: Secondary | ICD-10-CM | POA: Diagnosis not present

## 2018-02-20 DIAGNOSIS — N182 Chronic kidney disease, stage 2 (mild): Secondary | ICD-10-CM

## 2018-02-20 DIAGNOSIS — E785 Hyperlipidemia, unspecified: Secondary | ICD-10-CM

## 2018-02-20 DIAGNOSIS — R918 Other nonspecific abnormal finding of lung field: Secondary | ICD-10-CM | POA: Diagnosis not present

## 2018-02-20 DIAGNOSIS — L309 Dermatitis, unspecified: Secondary | ICD-10-CM | POA: Diagnosis not present

## 2018-02-20 DIAGNOSIS — I10 Essential (primary) hypertension: Secondary | ICD-10-CM

## 2018-02-20 LAB — BASIC METABOLIC PANEL
BUN: 12 mg/dL (ref 6–23)
CALCIUM: 9.4 mg/dL (ref 8.4–10.5)
CHLORIDE: 105 meq/L (ref 96–112)
CO2: 30 meq/L (ref 19–32)
Creatinine, Ser: 0.98 mg/dL (ref 0.40–1.20)
GFR: 62.43 mL/min (ref >60.00)
Glucose, Bld: 88 mg/dL (ref 70–99)
Potassium: 4 mEq/L (ref 3.5–5.1)
Sodium: 141 mEq/L (ref 135–145)

## 2018-02-20 LAB — HEPATIC FUNCTION PANEL
ALT: 15 U/L (ref 0–35)
AST: 22 U/L (ref 0–37)
Albumin: 4.2 g/dL (ref 3.5–5.2)
Alkaline Phosphatase: 81 U/L (ref 39–117)
BILIRUBIN DIRECT: 0.1 mg/dL (ref 0.0–0.3)
TOTAL PROTEIN: 6.8 g/dL (ref 6.0–8.3)
Total Bilirubin: 0.6 mg/dL (ref 0.2–1.2)

## 2018-02-20 LAB — LIPID PANEL
CHOLESTEROL: 137 mg/dL (ref 0–200)
HDL: 56 mg/dL (ref >39.00)
LDL Cholesterol: 62 mg/dL (ref 0–99)
NonHDL: 80.82
Total CHOL/HDL Ratio: 2
Triglycerides: 93 mg/dL (ref 0.0–149.0)
VLDL: 18.6 mg/dL (ref 0.0–40.0)

## 2018-02-20 MED ORDER — TRIAMCINOLONE ACETONIDE 0.1 % EX CREA: 1.0000 "application " | TOPICAL_CREAM | Freq: Two times a day (BID) | CUTANEOUS | 0 refills | Status: DC

## 2018-02-20 MED ORDER — ALBUTEROL SULFATE HFA 108 (90 BASE) MCG/ACT IN AERS: 2.0000 | INHALATION_SPRAY | Freq: Four times a day (QID) | RESPIRATORY_TRACT | 11 refills | Status: DC | PRN

## 2018-02-20 MED ORDER — ZOSTER VAC RECOMB ADJUVANTED 50 MCG/0.5ML IM SUSR: 0.5000 mL | Freq: Once | INTRAMUSCULAR | 1 refills | Status: AC

## 2018-02-20 MED ORDER — ROSUVASTATIN CALCIUM 20 MG PO TABS: 20.0000 mg | ORAL_TABLET | Freq: Every day | ORAL | 3 refills | Status: DC

## 2018-02-20 NOTE — Patient Instructions (Addendum)
Your shingles shot was sent to the pharmacy  Please take all new medication as prescribed - the steroid cream as needed, and the Albuterol inhaler if needed  Your EKG was OK today  Please continue all other medications as before, and refills have been done if requested.  Please have the pharmacy call with any other refills you may need.  Please continue your efforts at being more active, low cholesterol diet, and weight control.  Please keep your appointments with your specialists as you may have planned  You will be contacted regarding the referral for: Stress Test, and Echocardiogram  Please go to the XRAY Department in the Basement (go straight as you get off the elevator) for the x-ray testing  Please go to the LAB in the Basement (turn left off the elevator) for the tests to be done today  Please remember to sign up for MyChart if you have not done so, as this will be important to you in the future with finding out test results, communicating by private email, and scheduling acute appointments online when needed.

## 2018-02-20 NOTE — Assessment & Plan Note (Signed)
stable overall by history and exam, recent data reviewed with pt, and pt to continue medical treatment as before,  to f/u any worsening symptoms or concerns BP Readings from Last 3 Encounters:  02/20/18 104/68  08/11/17 130/86  07/01/17 124/86

## 2018-02-20 NOTE — Progress Notes (Signed)
Subjective:    Patient ID: Caitlyn Schmidt, female    DOB: 11-Apr-1961, 57 y.o.   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 ightness in the chest, just didn't feel right.   No radiation, n/v, diaphoresis, or palpiations or dizziness. No prior stress test or echo. She has hx of asthma and just does not think the was assoc with wheezing like she has had in the past.  BPs have been mostly 120s and 130/s, 60-70's except for one elevated at 145/76 after the day at the farm.   Did see renal, had MRI renal wo contrast, and due for f/u again the am with MRI renal with contrast.  Has been taking the crestor 20 mg regularly, does have some occas muscle pains but minor and tolerable.  Wt Readings from Last 3 Encounters:  02/20/18 155 lb (70.3 kg)  08/11/17 154 lb (69.9 kg)  07/01/17 157 lb (71.2 kg)   Past Medical History:  Diagnosis Date  . ADD (attention deficit disorder)   . Allergic rhinitis, mild   . Essential hypertension 06/04/2015  . Hyperlipidemia   . IBS (irritable bowel syndrome)   . Melanoma ([place]) 1995   Past Surgical History:  Procedure Laterality Date  . COLPOSCOPY    . WRIST SURGERY  2010    reports that she has never smoked. She has never used smokeless tobacco. She reports that she drinks alcohol. She reports that she does not use drugs. family history includes Cancer in her maternal grandmother, mother, and paternal grandfather; Diabetes in her maternal grandfather and maternal grandmother; Gout in her maternal grandfather; Heart disease in her maternal grandfather, paternal grandfather, and paternal grandmother; Hyperlipidemia in her father; Hypertension in her father, maternal grandmother, and mother; Kidney disease in her maternal grandfather; Macular  degeneration in her maternal grandmother; Osteoporosis in her maternal grandmother; Thyroid disease in her mother. Allergies  Allergen Reactions  . Prednisone     REACTION: Jittery   Current Outpatient Medications on File Prior to Visit  Medication Sig Dispense Refill  . Calcium-Vitamin D-Vitamin K (CALCIUM SOFT CHEWS PO) Take by mouth.    . cholecalciferol (VITAMIN D) 1000 UNITS tablet Take 4,000 Units by mouth daily.     . fexofenadine (ALLEGRA) 180 MG tablet Take 180 mg by mouth daily as needed.    . fluticasone (FLONASE) 50 MCG/ACT nasal spray Place 2 sprays into both nostrils daily. 16 g 0  . KRILL OIL PO Take by mouth.    . Multiple Vitamin (MULTIVITAMIN) tablet Take 1 tablet by mouth daily.    Marland Kitchen oxymetazoline (AFRIN NASAL SPRAY) 0.05 % nasal spray Place 1 spray into both nostrils 2 (two) times daily. Use only for 3days, then stop 30 mL 0   No current facility-administered medications on file prior to visit.    Review of Systems  Constitutional: Negative for other unusual diaphoresis or sweats HENT: Negative for ear discharge or swelling Eyes: Negative for other worsening visual disturbances Respiratory: Negative for stridor or other swelling  Gastrointestinal: Negative for worsening distension or other blood Genitourinary: Negative for retention or other urinary change Musculoskeletal: Negative for other MSK pain or swelling Skin: Negative for color change or other new lesions Neurological: Negative for worsening tremors and other  numbness  Psychiatric/Behavioral: Negative for worsening agitation or other fatigue All other system neg per pt    Objective:   Physical Exam BP 104/68   Pulse 73   Temp 98.1 F (36.7 C) (Oral)   Ht 5\' 9"  (1.753 m)   Wt 155 lb (70.3 kg)   SpO2 97%   BMI 22.89 kg/m  VS noted, non toxic Constitutional: Pt appears in NAD HENT: Head: NCAT.  Right Ear: External ear normal.  Left Ear: External ear normal.  Eyes: . Pupils are equal, round, and  reactive to light. Conjunctivae and EOM are normal Nose: without d/c or deformity Neck: Neck supple. Gross normal ROM Cardiovascular: Normal rate and regular rhythm.   Pulmonary/Chest: Effort normal and breath sounds without rales or wheezing.  Abd:  Soft, NT, ND, + BS, no organomegaly Neurological: Pt is alert. At baseline orientation, motor grossly intact Skin: Skin is warm. + mild scaly rashes to third MCP, no other new lesions, no LE edema Psychiatric: Pt behavior is normal without agitation  No other exam findings Lab Results  Component Value Date   WBC 7.6 07/01/2017   HGB 13.6 07/01/2017   HCT 41.7 07/01/2017   PLT 242.0 07/01/2017   GLUCOSE 95 07/01/2017   CHOL 262 (H) 07/01/2017   TRIG 208.0 (H) 07/01/2017   HDL 56.80 07/01/2017   LDLDIRECT 162.0 07/01/2017   LDLCALC 164 (H) 06/04/2015   ALT 13 07/01/2017   AST 20 07/01/2017   NA 139 07/01/2017   K 4.0 07/01/2017   CL 103 07/01/2017   CREATININE 0.99 07/01/2017   BUN 12 07/01/2017   CO2 29 07/01/2017   TSH 2.67 07/01/2017   ECG today I have personally interpreted: NSR      Assessment & Plan:

## 2018-02-20 NOTE — Assessment & Plan Note (Signed)
Lab Results  Component Value Date   LDLCALC 62 02/20/2018  stable overall by history and exam, recent data reviewed with pt, and pt to continue medical treatment as before,  to f/u any worsening symptoms or concerns

## 2018-02-20 NOTE — Assessment & Plan Note (Signed)
Ok for restart allegra, flonase asd,  to f/u any worsening symptoms or concerns

## 2018-02-20 NOTE — Assessment & Plan Note (Signed)
With mild rash , for steroid cream prn

## 2018-02-20 NOTE — Assessment & Plan Note (Signed)
Lab Results  Component Value Date   CREATININE 0.98 02/20/2018  stable overall by history and exam, recent data reviewed with pt, and pt to continue medical treatment as before,  to f/u any worsening symptoms or concerns

## 2018-02-20 NOTE — Assessment & Plan Note (Addendum)
Etiology unclear, atypical, ecg reviewed, for stress test  Note:  Total time for pt hx, exam, review of record with pt in the room, determination of diagnoses and plan for further eval and tx is > 40 min, with over 50% spent in coordination and counseling of patient including the differential dx, tx, further evaluation and other management of chest pain, dyspnea, ckd, HTN, HLD and rash

## 2018-02-20 NOTE — Assessment & Plan Note (Signed)
Etiology unclear, ? Asthma vs other, for cxr, echo

## 2018-02-21 ENCOUNTER — Telehealth: Payer: Self-pay

## 2018-02-21 ENCOUNTER — Ambulatory Visit
Admission: RE | Admit: 2018-02-21 | Discharge: 2018-02-21 | Disposition: A | Discharge status: Home / Self Care | Payer: BLUE CROSS/BLUE SHIELD | Source: Ambulatory Visit | Attending: Urology | Admitting: Urology

## 2018-02-21 DIAGNOSIS — D3000 Benign neoplasm of unspecified kidney: Secondary | ICD-10-CM

## 2018-02-21 DIAGNOSIS — N289 Disorder of kidney and ureter, unspecified: Secondary | ICD-10-CM | POA: Diagnosis not present

## 2018-02-21 MED ORDER — GADOBENATE DIMEGLUMINE 529 MG/ML IV SOLN
14.0000 mL | Freq: Once | INTRAVENOUS | Status: AC | PRN
  Administered 2018-02-21: 14 mL via INTRAVENOUS

## 2018-02-21 NOTE — Telephone Encounter (Signed)
-----   Message from Biagio Borg, MD sent at 02/20/2018  4:05 PM EDT ----- Letter sent, cont same tx except  The test results show that your current treatment is OK, except the chest xray is suggestive of possible COPD, and could be a later complication of asthma.  In addition to the tests already ordered, we should ask for lung testing called Pulmonary Function tests, which is a breathing test done on the second floor with Pulmonary.  You should also hear from the office about this.   Shirron to please inform pt, I will do order for PFT's

## 2018-02-21 NOTE — Telephone Encounter (Signed)
Called pt, LVM.   CRM created.  

## 2018-03-07 ENCOUNTER — Telehealth (HOSPITAL_COMMUNITY): Payer: Self-pay | Admitting: *Deleted

## 2018-03-07 NOTE — Telephone Encounter (Signed)
Patient given detailed instructions per Myocardial Perfusion Study Information Sheet for the test on 03/10/18 at 0715. Patient notified to arrive 15 minutes early and that it is imperative to arrive on time for appointment to keep from having the test rescheduled.  If you need to cancel or reschedule your appointment, please call the office within 24 hours of your appointment. . Patient verbalized understanding.Micaela Stith, Azia Toutant

## 2018-03-10 ENCOUNTER — Encounter: Payer: Self-pay | Admitting: Internal Medicine

## 2018-03-10 ENCOUNTER — Ambulatory Visit (HOSPITAL_COMMUNITY): Payer: BLUE CROSS/BLUE SHIELD | Attending: Cardiovascular Disease

## 2018-03-10 ENCOUNTER — Ambulatory Visit (HOSPITAL_BASED_OUTPATIENT_CLINIC_OR_DEPARTMENT_OTHER): Payer: BLUE CROSS/BLUE SHIELD

## 2018-03-10 ENCOUNTER — Other Ambulatory Visit: Payer: Self-pay

## 2018-03-10 DIAGNOSIS — I10 Essential (primary) hypertension: Secondary | ICD-10-CM | POA: Diagnosis not present

## 2018-03-10 DIAGNOSIS — R079 Chest pain, unspecified: Secondary | ICD-10-CM | POA: Diagnosis not present

## 2018-03-10 DIAGNOSIS — R0600 Dyspnea, unspecified: Secondary | ICD-10-CM | POA: Insufficient documentation

## 2018-03-10 DIAGNOSIS — E785 Hyperlipidemia, unspecified: Secondary | ICD-10-CM | POA: Insufficient documentation

## 2018-03-10 LAB — MYOCARDIAL PERFUSION IMAGING
CHL CUP NUCLEAR SDS: 5
CHL CUP NUCLEAR SRS: 8
CHL CUP NUCLEAR SSS: 11
CHL CUP RESTING HR STRESS: 59 {beats}/min
CSEPED: 10 min
CSEPEDS: 15 s
CSEPEW: 12.1 METS
CSEPHR: 96 %
LV dias vol: 76 mL (ref 46–106)
LVSYSVOL: 22 mL
MPHR: 165 {beats}/min
Peak HR: 160 {beats}/min
RATE: 0.3
TID: 0.94

## 2018-03-10 MED ORDER — TECHNETIUM TC 99M TETROFOSMIN IV KIT
32.6000 | PACK | Freq: Once | INTRAVENOUS | Status: AC | PRN
  Administered 2018-03-10: 32.6 via INTRAVENOUS
  Filled 2018-03-10: qty 33

## 2018-03-10 MED ORDER — TECHNETIUM TC 99M TETROFOSMIN IV KIT
10.1000 | PACK | Freq: Once | INTRAVENOUS | Status: AC | PRN
  Administered 2018-03-10: 10.1 via INTRAVENOUS
  Filled 2018-03-10: qty 11

## 2018-03-24 DIAGNOSIS — N281 Cyst of kidney, acquired: Secondary | ICD-10-CM | POA: Diagnosis not present

## 2018-03-24 DIAGNOSIS — N393 Stress incontinence (female) (male): Secondary | ICD-10-CM | POA: Diagnosis not present

## 2018-06-27 ENCOUNTER — Other Ambulatory Visit (INDEPENDENT_AMBULATORY_CARE_PROVIDER_SITE_OTHER): Payer: BLUE CROSS/BLUE SHIELD

## 2018-06-27 ENCOUNTER — Encounter: Payer: Self-pay | Admitting: Internal Medicine

## 2018-06-27 ENCOUNTER — Ambulatory Visit: Payer: BLUE CROSS/BLUE SHIELD | Admitting: Internal Medicine

## 2018-06-27 VITALS — BP 106/70 | HR 75 | Temp 98.1°F | Ht 69.0 in | Wt 158.0 lb

## 2018-06-27 DIAGNOSIS — R21 Rash and other nonspecific skin eruption: Secondary | ICD-10-CM | POA: Diagnosis not present

## 2018-06-27 DIAGNOSIS — L03113 Cellulitis of right upper limb: Secondary | ICD-10-CM | POA: Diagnosis not present

## 2018-06-27 DIAGNOSIS — I10 Essential (primary) hypertension: Secondary | ICD-10-CM

## 2018-06-27 DIAGNOSIS — E785 Hyperlipidemia, unspecified: Secondary | ICD-10-CM | POA: Diagnosis not present

## 2018-06-27 LAB — URINALYSIS, ROUTINE W REFLEX MICROSCOPIC
BILIRUBIN URINE: NEGATIVE
Hgb urine dipstick: NEGATIVE
Ketones, ur: NEGATIVE
LEUKOCYTES UA: NEGATIVE
NITRITE: NEGATIVE
PH: 6.5 (ref 5.0–8.0)
RBC / HPF: NONE SEEN (ref 0–?)
Specific Gravity, Urine: 1.01 (ref 1.000–1.030)
TOTAL PROTEIN, URINE-UPE24: NEGATIVE
URINE GLUCOSE: NEGATIVE
UROBILINOGEN UA: 0.2 (ref 0.0–1.0)

## 2018-06-27 LAB — CBC WITH DIFFERENTIAL/PLATELET
BASOS ABS: 0.1 10*3/uL (ref 0.0–0.1)
Basophils Relative: 0.9 % (ref 0.0–3.0)
EOS PCT: 4.3 % (ref 0.0–5.0)
Eosinophils Absolute: 0.3 10*3/uL (ref 0.0–0.7)
HCT: 39.6 % (ref 36.0–46.0)
Hemoglobin: 13.5 g/dL (ref 12.0–15.0)
LYMPHS ABS: 1.8 10*3/uL (ref 0.7–4.0)
LYMPHS PCT: 22.3 % (ref 12.0–46.0)
MCHC: 34 g/dL (ref 30.0–36.0)
MCV: 86.7 fl (ref 78.0–100.0)
MONOS PCT: 8 % (ref 3.0–12.0)
Monocytes Absolute: 0.7 10*3/uL (ref 0.1–1.0)
NEUTROS ABS: 5.3 10*3/uL (ref 1.4–7.7)
NEUTROS PCT: 64.5 % (ref 43.0–77.0)
PLATELETS: 205 10*3/uL (ref 150.0–400.0)
RBC: 4.57 Mil/uL (ref 3.87–5.11)
RDW: 12.5 % (ref 11.5–15.5)
WBC: 8.2 10*3/uL (ref 4.0–10.5)

## 2018-06-27 MED ORDER — TRIAMCINOLONE ACETONIDE 0.1 % EX CREA: 1.0000 "application " | TOPICAL_CREAM | Freq: Two times a day (BID) | CUTANEOUS | 0 refills | Status: AC

## 2018-06-27 MED ORDER — METHYLPREDNISOLONE 4 MG PO TBPK: ORAL_TABLET | ORAL | 0 refills | Status: DC

## 2018-06-27 MED ORDER — DOXYCYCLINE HYCLATE 100 MG PO TABS: 100.0000 mg | ORAL_TABLET | Freq: Two times a day (BID) | ORAL | 0 refills | Status: DC

## 2018-06-27 NOTE — Progress Notes (Signed)
Subjective:    Patient ID: Caitlyn Schmidt, female    DOB: [birth year], 57 y.o.   MRN: [id number]  HPI  Here with c/o onset itching mostly to the torso and back for 1 mo, then even more itchy rash to bilat upper shoulders and bilat upper abdomen, without fever, pain or swelling.  No known contact allergens.  Did change to a new detergent but not helping.  Also incidentally with bee sting yesteday to right thenar eminence with some initial red and swelling but that aspect improved today, but now with red/tender/swelling seeming to be spreading up the anterior arm halfway.  Very concerned about possible blood cancer and is very interested in labs today b/c her mother told her rash could be a sign of cancer from the internet.  Pt denies chest pain, increased sob or doe, wheezing, orthopnea, PND, increased LE swelling, palpitations, dizziness or syncope.   Pt denies polydipsia, polyuria or high fever, chills Past Medical History:  Diagnosis Date  . ADD (attention deficit disorder)   . Allergic rhinitis, mild   . Essential hypertension 06/04/2015  . Hyperlipidemia   . IBS (irritable bowel syndrome)   . Melanoma ([place]) 1995   Past Surgical History:  Procedure Laterality Date  . COLPOSCOPY    . WRIST SURGERY  2010    reports that she has never smoked. She has never used smokeless tobacco. She reports that she drinks alcohol. She reports that she does not use drugs. family history includes Cancer in her maternal grandmother, mother, and paternal grandfather; Diabetes in her maternal grandfather and maternal grandmother; Gout in her maternal grandfather; Heart disease in her maternal grandfather, paternal grandfather, and paternal grandmother; Hyperlipidemia in her father; Hypertension in her father, maternal grandmother, and mother; Kidney disease in her maternal grandfather; Macular degeneration in her maternal grandmother; Osteoporosis in her maternal grandmother; Thyroid disease in her  mother. Allergies  Allergen Reactions  . Prednisone     REACTION: Jittery   Current Outpatient Medications on File Prior to Visit  Medication Sig Dispense Refill  . albuterol (PROVENTIL HFA;VENTOLIN HFA) 108 (90 Base) MCG/ACT inhaler Inhale 2 puffs into the lungs every 6 (six) hours as needed for wheezing or shortness of breath. 1 Inhaler 11  . Calcium-Vitamin D-Vitamin K (CALCIUM SOFT CHEWS PO) Take by mouth.    . cholecalciferol (VITAMIN D) 1000 UNITS tablet Take 4,000 Units by mouth daily.     . fexofenadine (ALLEGRA) 180 MG tablet Take 180 mg by mouth daily as needed.    . fluticasone (FLONASE) 50 MCG/ACT nasal spray Place 2 sprays into both nostrils daily. 16 g 0  . KRILL OIL PO Take by mouth.    . Multiple Vitamin (MULTIVITAMIN) tablet Take 1 tablet by mouth daily.    Marland Kitchen oxymetazoline (AFRIN NASAL SPRAY) 0.05 % nasal spray Place 1 spray into both nostrils 2 (two) times daily. Use only for 3days, then stop 30 mL 0  . rosuvastatin (CRESTOR) 20 MG tablet Take 1 tablet (20 mg total) by mouth daily. 90 tablet 3   No current facility-administered medications on file prior to visit.    Review of Systems  Constitutional: Negative for other unusual diaphoresis or sweats HENT: Negative for ear discharge or swelling Eyes: Negative for other worsening visual disturbances Respiratory: Negative for stridor or other swelling  Gastrointestinal: Negative for worsening distension or other blood Genitourinary: Negative for retention or other urinary change Musculoskeletal: Negative for other MSK pain or swelling Skin: Negative for color change  or other new lesions Neurological: Negative for worsening tremors and other numbness  Psychiatric/Behavioral: Negative for worsening agitation or other fatigue All other system neg per pt    Objective:   Physical Exam BP 106/70   Pulse 75   Temp 98.1 F (36.7 C) (Oral)   Ht 5\' 9"  (1.753 m)   Wt 158 lb (71.7 kg)   SpO2 97%   BMI 23.33 kg/m  VS  noted, non toxic Constitutional: Pt appears in NAD HENT: Head: NCAT.  Right Ear: External ear normal.  Left Ear: External ear normal.  Eyes: . Pupils are equal, round, and reactive to light. Conjunctivae and EOM are normal Nose: without d/c or deformity Neck: Neck supple. Gross normal ROM Cardiovascular: Normal rate and regular rhythm.   Pulmonary/Chest: Effort normal and breath sounds without rales or wheezing.  Neurological: Pt is alert. At baseline orientation, motor grossly intact Skin: Skin is warm. + itchy slightly raised rashes abou 2 x 2 cm to the upper abd areas, and smaller lesions to the bilat shoudler, no other new lesions or rash to back, no LE edema; rue with z x 6 cm area to mid distal RUE just prox to the wrist red, tender with slight swelling only Psychiatric: Pt behavior is normal without agitation  No other exam findings    Assessment & Plan:

## 2018-06-27 NOTE — Assessment & Plan Note (Signed)
stable overall by history and exam, recent data reviewed with pt, and pt to continue medical treatment as before,  to f/u any worsening symptoms or concerns Lab Results  Component Value Date   LDLCALC 62 02/20/2018

## 2018-06-27 NOTE — Assessment & Plan Note (Signed)
Mild to mod, for antibx course,  to f/u any worsening symptoms or concerns 

## 2018-06-27 NOTE — Patient Instructions (Signed)
Please take all new medication as prescribed - the Medrol pak, and steroid cream as needed  Please take all new medication as prescribed - the antibiotic  Please continue all other medications as before, and refills have been done if requested.  Please have the pharmacy call with any other refills you may need.  Please continue your efforts at being more active, low cholesterol diet, and weight control.  Please keep your appointments with your specialists as you may have planned  Please go to the LAB in the Basement (turn left off the elevator) for the tests to be done today  You will be contacted by phone if any changes need to be made immediately.  Otherwise, you will receive a letter about your results with an explanation, but please check with MyChart first.  Please remember to sign up for MyChart if you have not done so, as this will be important to you in the future with finding out test results, communicating by private email, and scheduling acute appointments online when needed.

## 2018-06-27 NOTE — Assessment & Plan Note (Signed)
stable overall by history and exam, recent data reviewed with pt, and pt to continue medical treatment as before,  to f/u any worsening symptoms or concerns BP Readings from Last 3 Encounters:  06/27/18 106/70  02/20/18 104/68  08/11/17 130/86

## 2018-06-27 NOTE — Assessment & Plan Note (Signed)
C/w probable allergic type, for medrol pack and topical steroid asd, allegra daily prn,  to f/u any worsening symptoms or concerns

## 2018-06-28 ENCOUNTER — Encounter: Payer: Self-pay | Admitting: Internal Medicine

## 2018-06-28 LAB — HEPATIC FUNCTION PANEL
ALBUMIN: 4.3 g/dL (ref 3.5–5.2)
ALK PHOS: 72 U/L (ref 39–117)
ALT: 17 U/L (ref 0–35)
AST: 22 U/L (ref 0–37)
BILIRUBIN DIRECT: 0 mg/dL (ref 0.0–0.3)
TOTAL PROTEIN: 6.9 g/dL (ref 6.0–8.3)
Total Bilirubin: 0.3 mg/dL (ref 0.2–1.2)

## 2018-06-28 LAB — BASIC METABOLIC PANEL
BUN: 14 mg/dL (ref 6–23)
CALCIUM: 9.7 mg/dL (ref 8.4–10.5)
CO2: 30 mEq/L (ref 19–32)
CREATININE: 1.02 mg/dL (ref 0.40–1.20)
Chloride: 105 mEq/L (ref 96–112)
GFR: 59.53 mL/min — AB (ref >60.00)
Glucose, Bld: 84 mg/dL (ref 70–99)
Potassium: 4.1 mEq/L (ref 3.5–5.1)
SODIUM: 141 meq/L (ref 135–145)

## 2018-06-28 LAB — TSH: TSH: 1.95 u[IU]/mL (ref 0.35–4.50)

## 2018-06-28 LAB — LIPID PANEL
Cholesterol: 144 mg/dL (ref 0–200)
HDL: 50.9 mg/dL (ref >39.00)
NONHDL: 93.13
TRIGLYCERIDES: 293 mg/dL — AB (ref 0.0–149.0)
Total CHOL/HDL Ratio: 3
VLDL: 58.6 mg/dL — ABNORMAL HIGH (ref 0.0–40.0)

## 2018-06-28 LAB — LDL CHOLESTEROL, DIRECT: LDL DIRECT: 80 mg/dL

## 2018-08-21 DIAGNOSIS — D1801 Hemangioma of skin and subcutaneous tissue: Secondary | ICD-10-CM | POA: Diagnosis not present

## 2018-08-21 DIAGNOSIS — L821 Other seborrheic keratosis: Secondary | ICD-10-CM | POA: Diagnosis not present

## 2018-08-21 DIAGNOSIS — D225 Melanocytic nevi of trunk: Secondary | ICD-10-CM | POA: Diagnosis not present

## 2018-08-21 DIAGNOSIS — Z85820 Personal history of malignant melanoma of skin: Secondary | ICD-10-CM | POA: Diagnosis not present

## 2018-08-30 DIAGNOSIS — Z1231 Encounter for screening mammogram for malignant neoplasm of breast: Secondary | ICD-10-CM | POA: Diagnosis not present

## 2018-08-30 DIAGNOSIS — Z01419 Encounter for gynecological examination (general) (routine) without abnormal findings: Secondary | ICD-10-CM | POA: Diagnosis not present

## 2018-08-30 DIAGNOSIS — Z6822 Body mass index (BMI) 22.0-22.9, adult: Secondary | ICD-10-CM | POA: Diagnosis not present

## 2018-08-30 DIAGNOSIS — Z23 Encounter for immunization: Secondary | ICD-10-CM | POA: Diagnosis not present

## 2018-08-30 LAB — HM MAMMOGRAPHY

## 2019-03-20 DIAGNOSIS — L821 Other seborrheic keratosis: Secondary | ICD-10-CM | POA: Diagnosis not present

## 2019-03-20 DIAGNOSIS — L308 Other specified dermatitis: Secondary | ICD-10-CM | POA: Diagnosis not present

## 2019-03-20 DIAGNOSIS — D485 Neoplasm of uncertain behavior of skin: Secondary | ICD-10-CM | POA: Diagnosis not present

## 2019-05-23 ENCOUNTER — Ambulatory Visit (INDEPENDENT_AMBULATORY_CARE_PROVIDER_SITE_OTHER): Payer: BC Managed Care – PPO | Admitting: Internal Medicine

## 2019-05-23 ENCOUNTER — Encounter: Payer: Self-pay | Admitting: Internal Medicine

## 2019-05-23 DIAGNOSIS — E785 Hyperlipidemia, unspecified: Secondary | ICD-10-CM

## 2019-05-23 DIAGNOSIS — J309 Allergic rhinitis, unspecified: Secondary | ICD-10-CM | POA: Diagnosis not present

## 2019-05-23 DIAGNOSIS — R030 Elevated blood-pressure reading, without diagnosis of hypertension: Secondary | ICD-10-CM | POA: Insufficient documentation

## 2019-05-23 DIAGNOSIS — Z20828 Contact with and (suspected) exposure to other viral communicable diseases: Secondary | ICD-10-CM

## 2019-05-23 DIAGNOSIS — F419 Anxiety disorder, unspecified: Secondary | ICD-10-CM | POA: Diagnosis not present

## 2019-05-23 DIAGNOSIS — H6982 Other specified disorders of Eustachian tube, left ear: Secondary | ICD-10-CM

## 2019-05-23 DIAGNOSIS — Z20822 Contact with and (suspected) exposure to covid-19: Secondary | ICD-10-CM | POA: Insufficient documentation

## 2019-05-23 MED ORDER — TRIAMCINOLONE ACETONIDE 55 MCG/ACT NA AERO: 2.0000 | INHALATION_SPRAY | Freq: Every day | NASAL | 12 refills | Status: DC

## 2019-05-23 MED ORDER — FEXOFENADINE HCL 180 MG PO TABS: 180.0000 mg | ORAL_TABLET | Freq: Every day | ORAL | 3 refills | Status: DC | PRN

## 2019-05-23 MED ORDER — ROSUVASTATIN CALCIUM 20 MG PO TABS: 20.0000 mg | ORAL_TABLET | Freq: Every day | ORAL | 3 refills | Status: DC

## 2019-05-23 NOTE — Assessment & Plan Note (Signed)
Mild to mod, for allegra and nasacort asd,   to f/u any worsening symptoms or concerns 

## 2019-05-23 NOTE — Assessment & Plan Note (Signed)
.  stable overall by history and exam, recent data reviewed with pt, and pt to continue medical treatment as before,  to f/u any worsening symptoms or concern, for refill statin

## 2019-05-23 NOTE — Assessment & Plan Note (Signed)
Also for referral for testing per pt reqeust

## 2019-05-23 NOTE — Assessment & Plan Note (Signed)
To check BP at home 1-2 times per day for 2 wks and let us know if ave > 140/90

## 2019-05-23 NOTE — Assessment & Plan Note (Signed)
Situational, declines need for further tx

## 2019-05-23 NOTE — Patient Instructions (Signed)
Please take all new medication as prescribed - the allegra and nasacort  You can also take Mucinex (or it's generic off brand) for congestion, and tylenol as needed for pain.  Please continue all other medications as before, and refills have been done if requested - the statin  Please have the pharmacy call with any other refills you may need.  Please continue your efforts at being more active, low cholesterol diet, and weight control.  Please check your BP at home for 2 wks and let us know the results  Please keep your appointments with your specialists as you may have planned

## 2019-05-23 NOTE — Assessment & Plan Note (Addendum)
For mucinex otc prn,  to f/u any worsening symptoms or concerns  Note:  Total time for pt hx, exam, review of record with pt in the room, determination of diagnoses and plan for further eval and tx is > 40 min, with over 50% spent in coordination and counseling of patient including the differential dx, tx, further evaluation and other management of acute left eustachian valve dysfxn, allergic rhinitis, anxiety, elevated blood pressure, HLD, and need for covid testing

## 2019-05-23 NOTE — Progress Notes (Signed)
Patient ID: Caitlyn Schmidt, female   DOB: April 01, 1961, 58 y.o.   MRN: [id number]  Virtual Visit via Video Note  I connected with Caitlyn Schmidt on 05/23/19 at  2:20 PM EDT by a video enabled telemedicine application and verified that I am speaking with the correct person using two identifiers.  Location: Patient: at home Provider: at office   I discussed the limitations of evaluation and management by telemedicine and the availability of in person appointments. The patient expressed understanding and agreed to proceed.  History of Present Illness: Here with numerous issues, starting with 1 wk onset left ear pressure and fullness with intermittent lightheadedness, popping with talking and chewing and , in the setting of have several wks ongoing nasal allergy symptoms with clearish congestion, itch and sneezing, without fever, pain, ST, swelling or wheezing, though has had fatigue and intermittent cough as well.  Denies worsening reflux, abd pain, dysphagia, n/v, bowel change or blood. Has good appetite BP Readings from Last 3 Encounters:  06/27/18 106/70  02/20/18 104/68  08/11/17 130/86  Trying to follow lower chol diet, also need crestor refill.  BP has been mild elevated rcently a few times > 140/90, so she will check daily and do mychart message in 2 wks.  Denies worsening depressive symptoms, suicidal ideation, or panic; has ongoing anxiety, declines need for further tx at this time Past Medical History:  Diagnosis Date  . ADD (attention deficit disorder)   . Allergic rhinitis, mild   . Essential hypertension 06/04/2015  . Hyperlipidemia   . IBS (irritable bowel syndrome)   . Melanoma ([place]) 1995   Past Surgical History:  Procedure Laterality Date  . COLPOSCOPY    . WRIST SURGERY  2010    reports that she has never smoked. She has never used smokeless tobacco. She reports current alcohol use. She reports that she does not use drugs. family history includes Cancer in her maternal  grandmother, mother, and paternal grandfather; Diabetes in her maternal grandfather and maternal grandmother; Gout in her maternal grandfather; Heart disease in her maternal grandfather, paternal grandfather, and paternal grandmother; Hyperlipidemia in her father; Hypertension in her father, maternal grandmother, and mother; Kidney disease in her maternal grandfather; Macular degeneration in her maternal grandmother; Osteoporosis in her maternal grandmother; Thyroid disease in her mother. Allergies  Allergen Reactions  . Prednisone     REACTION: Jittery   Current Outpatient Medications on File Prior to Visit  Medication Sig Dispense Refill  . albuterol (PROVENTIL HFA;VENTOLIN HFA) 108 (90 Base) MCG/ACT inhaler Inhale 2 puffs into the lungs every 6 (six) hours as needed for wheezing or shortness of breath. 1 Inhaler 11  . Calcium-Vitamin D-Vitamin K (CALCIUM SOFT CHEWS PO) Take by mouth.    . cholecalciferol (VITAMIN D) 1000 UNITS tablet Take 4,000 Units by mouth daily.     Marland Kitchen doxycycline (VIBRA-TABS) 100 MG tablet Take 1 tablet (100 mg total) by mouth 2 (two) times daily. 20 tablet 0  . fexofenadine (ALLEGRA) 180 MG tablet Take 180 mg by mouth daily as needed.    . fluticasone (FLONASE) 50 MCG/ACT nasal spray Place 2 sprays into both nostrils daily. 16 g 0  . KRILL OIL PO Take by mouth.    . methylPREDNISolone (MEDROL DOSEPAK) 4 MG TBPK tablet 4tab by mouth x 3day,2tab x 3day,1tab x 3day 21 tablet 0  . Multiple Vitamin (MULTIVITAMIN) tablet Take 1 tablet by mouth daily.    Marland Kitchen oxymetazoline (AFRIN NASAL SPRAY) 0.05 % nasal spray Place  1 spray into both nostrils 2 (two) times daily. Use only for 3days, then stop 30 mL 0  . rosuvastatin (CRESTOR) 20 MG tablet Take 1 tablet (20 mg total) by mouth daily. 90 tablet 3  . triamcinolone cream (KENALOG) 0.1 % Apply 1 application topically 2 (two) times daily. 30 g 0   No current facility-administered medications on file prior to visit.     Observations/Objective: Alert, NAD, appropriate mood and affect, resps normal, cn 2-12 intact, moves all 4s, no visible rash or swelling Lab Results  Component Value Date   WBC 8.2 06/27/2018   HGB 13.5 06/27/2018   HCT 39.6 06/27/2018   PLT 205.0 06/27/2018   GLUCOSE 84 06/27/2018   CHOL 144 06/27/2018   TRIG 293.0 (H) 06/27/2018   HDL 50.90 06/27/2018   LDLDIRECT 80.0 06/27/2018   LDLCALC 62 02/20/2018   ALT 17 06/27/2018   AST 22 06/27/2018   NA 141 06/27/2018   K 4.1 06/27/2018   CL 105 06/27/2018   CREATININE 1.02 06/27/2018   BUN 14 06/27/2018   CO2 30 06/27/2018   TSH 1.95 06/27/2018    Assessment and Plan: See notes  Follow Up Instructions: See notes   I discussed the assessment and treatment plan with the patient. The patient was provided an opportunity to ask questions and all were answered. The patient agreed with the plan and demonstrated an understanding of the instructions.   The patient was advised to call back or seek an in-person evaluation if the symptoms worsen or if the condition fails to improve as anticipated.   Cathlean Cower, MD

## 2019-05-24 ENCOUNTER — Other Ambulatory Visit: Payer: Self-pay

## 2019-05-24 DIAGNOSIS — R6889 Other general symptoms and signs: Secondary | ICD-10-CM | POA: Diagnosis not present

## 2019-05-24 DIAGNOSIS — Z20822 Contact with and (suspected) exposure to covid-19: Secondary | ICD-10-CM

## 2019-05-27 LAB — NOVEL CORONAVIRUS, NAA: SARS-CoV-2, NAA: NOT DETECTED

## 2019-05-28 ENCOUNTER — Telehealth: Payer: Self-pay

## 2019-05-28 NOTE — Telephone Encounter (Signed)
Pt has viewed results via MyChart  

## 2019-05-28 NOTE — Telephone Encounter (Signed)
-----   Message from Biagio Borg, MD sent at 05/27/2019  6:40 PM EDT ----- Left message on MyChart, pt to cont same tx   Shirron to please inform pt, pt is COVID neg

## 2019-09-03 DIAGNOSIS — Z1231 Encounter for screening mammogram for malignant neoplasm of breast: Secondary | ICD-10-CM | POA: Diagnosis not present

## 2019-09-03 DIAGNOSIS — Z6823 Body mass index (BMI) 23.0-23.9, adult: Secondary | ICD-10-CM | POA: Diagnosis not present

## 2019-09-03 DIAGNOSIS — Z01419 Encounter for gynecological examination (general) (routine) without abnormal findings: Secondary | ICD-10-CM | POA: Diagnosis not present

## 2019-09-03 LAB — HM MAMMOGRAPHY

## 2019-09-07 DIAGNOSIS — Z1329 Encounter for screening for other suspected endocrine disorder: Secondary | ICD-10-CM | POA: Diagnosis not present

## 2019-09-07 DIAGNOSIS — Z0000 Encounter for general adult medical examination without abnormal findings: Secondary | ICD-10-CM | POA: Diagnosis not present

## 2019-09-07 DIAGNOSIS — Z131 Encounter for screening for diabetes mellitus: Secondary | ICD-10-CM | POA: Diagnosis not present

## 2019-09-07 DIAGNOSIS — Z13220 Encounter for screening for lipoid disorders: Secondary | ICD-10-CM | POA: Diagnosis not present

## 2019-09-07 DIAGNOSIS — Z130 Encounter for screening for diseases of the blood and blood-forming organs and certain disorders involving the immune mechanism: Secondary | ICD-10-CM | POA: Diagnosis not present

## 2020-01-14 DIAGNOSIS — L821 Other seborrheic keratosis: Secondary | ICD-10-CM | POA: Diagnosis not present

## 2020-01-14 DIAGNOSIS — L218 Other seborrheic dermatitis: Secondary | ICD-10-CM | POA: Diagnosis not present

## 2020-01-14 DIAGNOSIS — L820 Inflamed seborrheic keratosis: Secondary | ICD-10-CM | POA: Diagnosis not present

## 2020-01-14 DIAGNOSIS — D1801 Hemangioma of skin and subcutaneous tissue: Secondary | ICD-10-CM | POA: Diagnosis not present

## 2020-01-14 DIAGNOSIS — D485 Neoplasm of uncertain behavior of skin: Secondary | ICD-10-CM | POA: Diagnosis not present

## 2020-01-14 DIAGNOSIS — D225 Melanocytic nevi of trunk: Secondary | ICD-10-CM | POA: Diagnosis not present

## 2020-05-29 ENCOUNTER — Encounter: Payer: Self-pay | Admitting: Internal Medicine

## 2020-05-29 ENCOUNTER — Telehealth (INDEPENDENT_AMBULATORY_CARE_PROVIDER_SITE_OTHER): Payer: BC Managed Care – PPO | Admitting: Internal Medicine

## 2020-05-29 ENCOUNTER — Other Ambulatory Visit: Payer: Self-pay

## 2020-05-29 DIAGNOSIS — J309 Allergic rhinitis, unspecified: Secondary | ICD-10-CM

## 2020-05-29 NOTE — Assessment & Plan Note (Signed)
Advised to get tested for covid-19 to be cautious and resume flonase she has left at home to help with symptoms.

## 2020-05-29 NOTE — Progress Notes (Signed)
Virtual Visit via Video Note  I connected with Caitlyn Schmidt on 05/29/20 at  8:00 AM EDT by a video enabled telemedicine application and verified that I am speaking with the correct person using two identifiers.  The patient and the provider were at separate locations throughout the entire encounter. Patient location: home, Provider location: work   I discussed the limitations of evaluation and management by telemedicine and the availability of in person appointments. The patient expressed understanding and agreed to proceed. The patient and the provider were the only parties present for the visit unless noted in HPI below.  History of Present Illness: The patient is a 59 y.o. female with visit for sinus problems. Started Monday with fatigue, having some malaise. Some nasal tone to the touch. Glands are swollen and sinus pressure in her face. Denies fevers or chills. Mild cough. Has been feeling some hot at times. Denies known exposure to covid-19. Has tried nothing. Is not taking anything for allergies recently. Had the phizer vaccine both doses back in April. Took elderly parents to the beach last week and son leaving soon for college and she is concerned about covid-19.   Observations/Objective: Appearance: normal, mild cough during visit, no dyspnea, breathing appears normal, casual grooming, abdomen does not appear distended, mental status is A and O times 3  Assessment and Plan: See problem oriented charting  Follow Up Instructions: resume flonase, get covid-19 testing  Visit time 15 minutes in face to face communication with patient and coordination of care, additional 5 minutes spent in record review, coordination or care, ordering tests, communicating/referring to other healthcare professionals, documenting in medical records all on the same day of the visit for total time 20 minutes spent on the visit.    I discussed the assessment and treatment plan with the patient. The patient was  provided an opportunity to ask questions and all were answered. The patient agreed with the plan and demonstrated an understanding of the instructions.   The patient was advised to call back or seek an in-person evaluation if the symptoms worsen or if the condition fails to improve as anticipated.  Hoyt Koch, MD

## 2020-05-30 DIAGNOSIS — Z20822 Contact with and (suspected) exposure to covid-19: Secondary | ICD-10-CM | POA: Diagnosis not present

## 2020-06-05 ENCOUNTER — Telehealth (INDEPENDENT_AMBULATORY_CARE_PROVIDER_SITE_OTHER): Payer: BC Managed Care – PPO | Admitting: Internal Medicine

## 2020-06-05 ENCOUNTER — Encounter: Payer: Self-pay | Admitting: Internal Medicine

## 2020-06-05 DIAGNOSIS — J019 Acute sinusitis, unspecified: Secondary | ICD-10-CM | POA: Insufficient documentation

## 2020-06-05 DIAGNOSIS — J0110 Acute frontal sinusitis, unspecified: Secondary | ICD-10-CM

## 2020-06-05 MED ORDER — FLUTICASONE PROPIONATE 50 MCG/ACT NA SUSP
2.0000 | Freq: Every day | NASAL | 6 refills | Status: DC
Start: 2020-06-05 — End: 2022-12-29

## 2020-06-05 MED ORDER — AMOXICILLIN-POT CLAVULANATE 875-125 MG PO TABS
1.0000 | ORAL_TABLET | Freq: Two times a day (BID) | ORAL | 0 refills | Status: DC
Start: 2020-06-05 — End: 2020-10-27

## 2020-06-05 NOTE — Assessment & Plan Note (Signed)
Refill flonase as she did not have this at home as she previously suspected. Rx augmentin for sinus infection.

## 2020-06-05 NOTE — Progress Notes (Signed)
Virtual Visit via Video Note  I connected with Caitlyn Schmidt on 06/05/20 at  8:40 AM EDT by a video enabled telemedicine application and verified that I am speaking with the correct person using two identifiers.  The patient and the provider were at separate locations throughout the entire encounter. Patient location: home, Provider location: work   I discussed the limitations of evaluation and management by telemedicine and the availability of in person appointments. The patient expressed understanding and agreed to proceed. The patient and the provider were the only parties present for the visit unless noted in HPI below.  History of Present Illness: The patient is a 59 y.o. female with visit for congestion and sinus pressure. Had prior virtual visit 05/29/20 and encouraged to get covid-19 testing and start flonase for symptoms. Started about 1-2 weeks ago now. Has not taken flonase and did also stop taking allegra. She is still having drainage and some lightheadedness from the drainage. Overall feeling tired. Covid-19 testing was negative. Denies fevers or chills. Overall it is not improving and a lot of throat drainage. Has tried tylenol cold and flu  Observations/Objective: Appearance: normal, breathing appears normal, casual grooming, abdomen does not appear distended, throat with drainage, memory normal, mental status is A and O times 3  Assessment and Plan: See problem oriented charting  Follow Up Instructions: rx augmentin and flonase  I discussed the assessment and treatment plan with the patient. The patient was provided an opportunity to ask questions and all were answered. The patient agreed with the plan and demonstrated an understanding of the instructions.   The patient was advised to call back or seek an in-person evaluation if the symptoms worsen or if the condition fails to improve as anticipated.  Hoyt Koch, MD

## 2020-10-13 ENCOUNTER — Telehealth: Payer: Self-pay | Admitting: Internal Medicine

## 2020-10-13 DIAGNOSIS — E559 Vitamin D deficiency, unspecified: Secondary | ICD-10-CM

## 2020-10-13 DIAGNOSIS — E538 Deficiency of other specified B group vitamins: Secondary | ICD-10-CM

## 2020-10-13 DIAGNOSIS — Z0000 Encounter for general adult medical examination without abnormal findings: Secondary | ICD-10-CM

## 2020-10-13 NOTE — Telephone Encounter (Signed)
Sent to Dr. John. 

## 2020-10-13 NOTE — Telephone Encounter (Signed)
Patient wondering if she needs to get her labs done before her physcial on 01.24.22 [phone]

## 2020-10-13 NOTE — Telephone Encounter (Signed)
[facility] labs ordered for the [facility] lab - please let pt know

## 2020-10-14 NOTE — Telephone Encounter (Signed)
Spoke with pt and was able to inform he of Dr. Gwynn Burly instructions. Pt stated she understood.

## 2020-10-23 ENCOUNTER — Other Ambulatory Visit: Payer: BC Managed Care – PPO

## 2020-10-23 DIAGNOSIS — Z20822 Contact with and (suspected) exposure to covid-19: Secondary | ICD-10-CM

## 2020-10-25 LAB — NOVEL CORONAVIRUS, NAA: SARS-CoV-2, NAA: NOT DETECTED

## 2020-10-25 LAB — SARS-COV-2, NAA 2 DAY TAT

## 2020-10-27 ENCOUNTER — Encounter: Payer: Self-pay | Admitting: Physician Assistant

## 2020-10-27 ENCOUNTER — Telehealth (INDEPENDENT_AMBULATORY_CARE_PROVIDER_SITE_OTHER): Payer: BC Managed Care – PPO | Admitting: Physician Assistant

## 2020-10-27 VITALS — Ht 69.0 in | Wt 152.0 lb

## 2020-10-27 DIAGNOSIS — J0110 Acute frontal sinusitis, unspecified: Secondary | ICD-10-CM | POA: Diagnosis not present

## 2020-10-27 MED ORDER — AMOXICILLIN-POT CLAVULANATE 875-125 MG PO TABS: 1.0000 | ORAL_TABLET | Freq: Two times a day (BID) | ORAL | 0 refills | Status: DC

## 2020-10-27 NOTE — Progress Notes (Addendum)
Virtual Visit via Video   I connected with Caitlyn Schmidt on 10/27/20 at  4:00 PM EST by a video enabled telemedicine application and verified that I am speaking with the correct person using two identifiers. Location patient: Home Location provider: [facility], Office Persons participating in the virtual visit: Ceirra, Belli PA-C, Corky Mull, LPN   I discussed the limitations of evaluation and management by telemedicine and the availability of in person appointments. The patient expressed understanding and agreed to proceed.  I acted as a Neurosurgeon for [employer], PA-C [employer], LPN  Subjective:   HPI:   Sinus problem Pt is c/o sinus pressure, clear nasal drainage and post nasal drip x 1 week. Also having ear fullness and fatigue. Can feel lightheaded at times, without vision changes, unusual HA/weakness. No fever that she is aware of but has had chills.  She has been taking Advil Cold and Sinus with little relief and Tylenol and Flonase. Pt had COVID test last week got result back on Thursday and it was Negative.  Has hx of sinus infections with similar presentation in the past. Has not been around anyone with similar symptoms.  Symptoms are getting not better or worse.    ROS: See pertinent positives and negatives per HPI.  Patient Active Problem List   Diagnosis Date Noted  . Acute sinusitis 06/05/2020  . Acute dysfunction of left eustachian tube 05/23/2019  . Anxiety 05/23/2019  . Blood pressure elevated without history of HTN 05/23/2019  . Exposure to COVID-19 virus 05/23/2019  . Right arm cellulitis 06/27/2018  . Rash 06/27/2018  . Chest pain 02/20/2018  . Dermatitis 02/20/2018  . Dyspnea 02/20/2018  . CKD (chronic kidney disease) stage 2, GFR 60-89 ml/min 07/01/2017  . Essential hypertension 06/04/2015  . Pain in toe of left foot 12/25/2014  . Metatarsal deformity 04/25/2013  . Encounter for well adult exam without abnormal  findings 04/19/2013  . Deformity of toenail 04/19/2013  . ADD 05/06/2009  . Hyperlipidemia 05/26/2007  . HEMORRHOIDS 05/26/2007  . Allergic rhinitis 05/26/2007  . IBS 05/26/2007  . HX, PERSONAL, MALIGNANCY, SKIN MELANOMA 05/26/2007  . INFECTIOUS MONONUCLEOSIS, HX OF 05/26/2007    Social History   Tobacco Use  . Smoking status: Never Smoker  . Smokeless tobacco: Never Used  Substance Use Topics  . Alcohol use: Yes    Alcohol/week: 0.0 standard drinks    Current Outpatient Medications:  .  amoxicillin-clavulanate (AUGMENTIN) 875-125 MG tablet, Take 1 tablet by mouth 2 (two) times daily., Disp: 20 tablet, Rfl: 0 .  ASHWAGANDHA PO, Take 2,000 mg by mouth daily in the afternoon., Disp: , Rfl:  .  Calcium-Vitamin D-Vitamin K (CALCIUM SOFT CHEWS PO), Take by mouth., Disp: , Rfl:  .  cholecalciferol (VITAMIN D) 1000 UNITS tablet, Take 2,000 Units by mouth daily., Disp: , Rfl:  .  fluticasone (FLONASE) 50 MCG/ACT nasal spray, Place 2 sprays into both nostrils daily., Disp: 16 g, Rfl: 6 .  Multiple Vitamin (MULTIVITAMIN) tablet, Take 1 tablet by mouth daily., Disp: , Rfl:  .  Omega-3 Fatty Acids (FISH OIL OMEGA-3 PO), Take 1,400 mg by mouth daily in the afternoon., Disp: , Rfl:  .  oxymetazoline (AFRIN NASAL SPRAY) 0.05 % nasal spray, Place 1 spray into both nostrils 2 (two) times daily. Use only for 3days, then stop, Disp: 30 mL, Rfl: 0 .  rosuvastatin (CRESTOR) 20 MG tablet, Take 1 tablet (20 mg total) by mouth daily., Disp: 90 tablet,  Rfl: 3  Allergies  Allergen Reactions  . Prednisone     REACTION: Jittery    Objective:   VITALS: Per patient if applicable, see vitals. GENERAL: Alert, appears well and in no acute distress. HEENT: Atraumatic, conjunctiva clear, no obvious abnormalities on inspection of external nose and ears. NECK: Normal movements of the head and neck. CARDIOPULMONARY: No increased WOB. Speaking in clear sentences. I:E ratio WNL.  MS: Moves all visible  extremities without noticeable abnormality. PSYCH: Pleasant and cooperative, well-groomed. Speech normal rate and rhythm. Affect is appropriate. Insight and judgement are appropriate. Attention is focused, linear, and appropriate.  NEURO: CN grossly intact. Oriented as arrived to appointment on time with no prompting. Moves both UE equally.  SKIN: No obvious lesions, wounds, erythema, or cyanosis noted on face or hands.  Assessment and Plan:   Caitlyn Schmidt was seen today for sinus problem.  Diagnoses and all orders for this visit:  Acute non-recurrent frontal sinusitis  Other orders -     amoxicillin-clavulanate (AUGMENTIN) 875-125 MG tablet; Take 1 tablet by mouth 2 (two) times daily.   No red flags on discussion.  Will initiate oral augmentin per orders. Discussed taking medications as prescribed. Reviewed return precautions including worsening fever, SOB, worsening cough or other concerns. Push fluids and rest. I recommend that patient follow-up if symptoms worsen or persist despite treatment x 7-10 days, sooner if needed.  I discussed the assessment and treatment plan with the patient. The patient was provided an opportunity to ask questions and all were answered. The patient agreed with the plan and demonstrated an understanding of the instructions.   The patient was advised to call back or seek an in-person evaluation if the symptoms worsen or if the condition fails to improve as anticipated.   CMA or LPN served as scribe during this visit. History, Physical, and Plan performed by medical provider. The above documentation has been reviewed and is accurate and complete.   [place], [place] 10/27/2020

## 2020-11-17 ENCOUNTER — Encounter: Payer: BC Managed Care – PPO | Admitting: Internal Medicine

## 2020-11-25 ENCOUNTER — Other Ambulatory Visit (INDEPENDENT_AMBULATORY_CARE_PROVIDER_SITE_OTHER): Payer: BC Managed Care – PPO

## 2020-11-25 DIAGNOSIS — Z0000 Encounter for general adult medical examination without abnormal findings: Secondary | ICD-10-CM

## 2020-11-25 DIAGNOSIS — E538 Deficiency of other specified B group vitamins: Secondary | ICD-10-CM

## 2020-11-25 DIAGNOSIS — E559 Vitamin D deficiency, unspecified: Secondary | ICD-10-CM

## 2020-11-25 LAB — CBC WITH DIFFERENTIAL/PLATELET
Basophils Absolute: 0.1 10*3/uL (ref 0.0–0.1)
Basophils Relative: 0.9 % (ref 0.0–3.0)
Eosinophils Absolute: 0.3 10*3/uL (ref 0.0–0.7)
Eosinophils Relative: 3.8 % (ref 0.0–5.0)
HCT: 40.5 % (ref 36.0–46.0)
Hemoglobin: 13.7 g/dL (ref 12.0–15.0)
Lymphocytes Relative: 23.3 % (ref 12.0–46.0)
Lymphs Abs: 1.6 10*3/uL (ref 0.7–4.0)
MCHC: 33.8 g/dL (ref 30.0–36.0)
MCV: 87.6 fl (ref 78.0–100.0)
Monocytes Absolute: 0.6 10*3/uL (ref 0.1–1.0)
Monocytes Relative: 9.1 % (ref 3.0–12.0)
Neutro Abs: 4.3 10*3/uL (ref 1.4–7.7)
Neutrophils Relative %: 62.9 % (ref 43.0–77.0)
Platelets: 219 10*3/uL (ref 150.0–400.0)
RBC: 4.62 Mil/uL (ref 3.87–5.11)
RDW: 12.2 % (ref 11.5–15.5)
WBC: 6.8 10*3/uL (ref 4.0–10.5)

## 2020-11-25 LAB — VITAMIN D 25 HYDROXY (VIT D DEFICIENCY, FRACTURES): VITD: 47.12 ng/mL (ref 30.00–100.00)

## 2020-11-25 LAB — HEPATIC FUNCTION PANEL
ALT: 14 U/L (ref 0–35)
AST: 19 U/L (ref 0–37)
Albumin: 4.3 g/dL (ref 3.5–5.2)
Alkaline Phosphatase: 70 U/L (ref 39–117)
Bilirubin, Direct: 0.1 mg/dL (ref 0.0–0.3)
Total Bilirubin: 0.5 mg/dL (ref 0.2–1.2)
Total Protein: 6.8 g/dL (ref 6.0–8.3)

## 2020-11-25 LAB — URINALYSIS, ROUTINE W REFLEX MICROSCOPIC
Bilirubin Urine: NEGATIVE
Hgb urine dipstick: NEGATIVE
Ketones, ur: NEGATIVE
Leukocytes,Ua: NEGATIVE
Nitrite: NEGATIVE
Specific Gravity, Urine: >=1.03 — AB (ref 1.000–1.030)
Total Protein, Urine: NEGATIVE
Urine Glucose: NEGATIVE
Urobilinogen, UA: 0.2 (ref 0.0–1.0)
pH: 5.5 (ref 5.0–8.0)

## 2020-11-25 LAB — BASIC METABOLIC PANEL
BUN: 12 mg/dL (ref 6–23)
CO2: 28 mEq/L (ref 19–32)
Calcium: 9.5 mg/dL (ref 8.4–10.5)
Chloride: 107 mEq/L (ref 96–112)
Creatinine, Ser: 0.99 mg/dL (ref 0.40–1.20)
GFR: 62.8 mL/min (ref >60.00)
Glucose, Bld: 95 mg/dL (ref 70–99)
Potassium: 4 mEq/L (ref 3.5–5.1)
Sodium: 141 mEq/L (ref 135–145)

## 2020-11-25 LAB — LIPID PANEL
Cholesterol: 148 mg/dL (ref 0–200)
HDL: 59.6 mg/dL (ref >39.00)
LDL Cholesterol: 69 mg/dL (ref 0–99)
NonHDL: 88.6
Total CHOL/HDL Ratio: 2
Triglycerides: 96 mg/dL (ref 0.0–149.0)
VLDL: 19.2 mg/dL (ref 0.0–40.0)

## 2020-11-25 LAB — TSH: TSH: 2.62 u[IU]/mL (ref 0.35–4.50)

## 2020-11-25 LAB — VITAMIN B12: Vitamin B-12: 611 pg/mL (ref 211–911)

## 2020-12-02 ENCOUNTER — Other Ambulatory Visit: Payer: Self-pay

## 2020-12-03 ENCOUNTER — Ambulatory Visit (INDEPENDENT_AMBULATORY_CARE_PROVIDER_SITE_OTHER): Payer: BC Managed Care – PPO | Admitting: Internal Medicine

## 2020-12-03 ENCOUNTER — Encounter: Payer: Self-pay | Admitting: Internal Medicine

## 2020-12-03 VITALS — BP 108/70 | HR 63 | Temp 98.0°F | Ht 69.0 in | Wt 154.0 lb

## 2020-12-03 DIAGNOSIS — Z6822 Body mass index (BMI) 22.0-22.9, adult: Secondary | ICD-10-CM | POA: Diagnosis not present

## 2020-12-03 DIAGNOSIS — Z01419 Encounter for gynecological examination (general) (routine) without abnormal findings: Secondary | ICD-10-CM | POA: Diagnosis not present

## 2020-12-03 DIAGNOSIS — Z1231 Encounter for screening mammogram for malignant neoplasm of breast: Secondary | ICD-10-CM | POA: Diagnosis not present

## 2020-12-03 DIAGNOSIS — E559 Vitamin D deficiency, unspecified: Secondary | ICD-10-CM

## 2020-12-03 DIAGNOSIS — Z0000 Encounter for general adult medical examination without abnormal findings: Secondary | ICD-10-CM | POA: Diagnosis not present

## 2020-12-03 DIAGNOSIS — E785 Hyperlipidemia, unspecified: Secondary | ICD-10-CM | POA: Diagnosis not present

## 2020-12-03 DIAGNOSIS — G479 Sleep disorder, unspecified: Secondary | ICD-10-CM | POA: Diagnosis not present

## 2020-12-03 DIAGNOSIS — I10 Essential (primary) hypertension: Secondary | ICD-10-CM

## 2020-12-03 MED ORDER — ROSUVASTATIN CALCIUM 20 MG PO TABS: 20.0000 mg | ORAL_TABLET | Freq: Every day | ORAL | 3 refills | Status: DC

## 2020-12-03 NOTE — Patient Instructions (Signed)
We have discussed the Cardiac CT Score test to measure the calcification level (if any) in your heart arteries.  This test has been ordered in our Computer System, so please call  CT directly, as they prefer this, at [phone] to be scheduled.  Please continue all other medications as before, and refills have been done if requested.  Please have the pharmacy call with any other refills you may need.  Please continue your efforts at being more active, low cholesterol diet, and weight control.  You are otherwise up to date with prevention measures today.  Please keep your appointments with your specialists as you may have planned  Please make an Appointment to return for your 1 year visit, or sooner if needed, with Lab testing by Appointment as well, to be done about 3-5 days before at the FIRST FLOOR Lab (so this is for TWO appointments - please see the scheduling desk as you leave)   Due to the ongoing Covid 19 pandemic, our lab now requires an appointment for any labs done at our office.  If you need labs done and do not have an appointment, please call our office ahead of time to schedule before presenting to the lab for your testing.  

## 2020-12-03 NOTE — Progress Notes (Addendum)
Patient ID: Caitlyn Schmidt, female   DOB: 10/14/1961, 60 y.o.   MRN: [id number]         Chief Complaint:: wellness exam and HLD       HPI:  Caitlyn Schmidt is a 60 y.o. female here for wellness exam, o/w up to date   Wt Readings from Last 3 Encounters:  12/03/20 154 lb (69.9 kg)  10/27/20 152 lb (68.9 kg)  06/27/18 158 lb (71.7 kg)   BP Readings from Last 3 Encounters:  12/03/20 108/70  06/27/18 106/70  02/20/18 104/68   Immunization History  Administered Date(s) Administered  . Influenza,inj,Quad PF,6+ Mos 07/01/2017, 07/20/2019, 08/16/2020  . PFIZER(Purple Top)SARS-COV-2 Vaccination 09/20/2020  . Td 10/25/1998  . Tdap 06/04/2015  . Zoster Recombinat (Shingrix) 02/22/2018, 09/07/2018   Health Maintenance Due  Topic Date Due  . PAP SMEAR-Modifier  05/16/2020         Also seeing employee counseling and doing better, declines need for medicatoin, no SI or HI.  Saw GYn today, Has some sleep difficulty, has not yet tried melatonin, but plans to try to avoid stressors.    Past Medical History:  Diagnosis Date  . ADD (attention deficit disorder)   . Allergic rhinitis, mild   . Essential hypertension 06/04/2015  . Hyperlipidemia   . IBS (irritable bowel syndrome)   . Melanoma ([place]) 1995   Past Surgical History:  Procedure Laterality Date  . COLPOSCOPY    . WRIST SURGERY  2010    reports that she has never smoked. She has never used smokeless tobacco. She reports current alcohol use. She reports that she does not use drugs. family history includes Cancer in her maternal grandmother, mother, and paternal grandfather; Diabetes in her maternal grandfather and maternal grandmother; Gout in her maternal grandfather; Heart disease in her maternal grandfather, paternal grandfather, and paternal grandmother; Hyperlipidemia in her father; Hypertension in her father, maternal grandmother, and mother; Kidney disease in her maternal grandfather; Macular degeneration in her maternal  grandmother; Osteoporosis in her maternal grandmother; Thyroid disease in her mother. Allergies  Allergen Reactions  . Prednisone     REACTION: Jittery   Current Outpatient Medications on File Prior to Visit  Medication Sig Dispense Refill  . ASHWAGANDHA PO Take 2,000 mg by mouth daily in the afternoon.    . Calcium-Vitamin D-Vitamin K (CALCIUM SOFT CHEWS PO) Take by mouth.    . cholecalciferol (VITAMIN D) 1000 UNITS tablet Take 2,000 Units by mouth daily.    . fluticasone (FLONASE) 50 MCG/ACT nasal spray Place 2 sprays into both nostrils daily. 16 g 6  . Multiple Vitamin (MULTIVITAMIN) tablet Take 1 tablet by mouth daily.    . Omega-3 Fatty Acids (FISH OIL OMEGA-3 PO) Take 1,400 mg by mouth daily in the afternoon.     No current facility-administered medications on file prior to visit.        ROS:  All others reviewed and negative.  Objective        PE:  BP 108/70   Pulse 63   Temp 98 F (36.7 C) (Oral)   Ht 5\' 9"  (1.753 m)   Wt 154 lb (69.9 kg)   SpO2 99%   BMI 22.74 kg/m                 Constitutional: Pt appears in NAD               HENT: Head: NCAT.  Right Ear: External ear normal.                 Left Ear: External ear normal.                Eyes: . Pupils are equal, round, and reactive to light. Conjunctivae and EOM are normal               Nose: without d/c or deformity               Neck: Neck supple. Gross normal ROM               Cardiovascular: Normal rate and regular rhythm.                 Pulmonary/Chest: Effort normal and breath sounds without rales or wheezing.                Abd:  Soft, NT, ND, + BS, no organomegaly               Neurological: Pt is alert. At baseline orientation, motor grossly intact               Skin: Skin is warm. No rashes, no other new lesions, LE edema - none               Psychiatric: Pt behavior is normal without agitation   Micro: none  Cardiac tracings I have personally interpreted today:  none  Pertinent  Radiological findings (summarize): none   Lab Results  Component Value Date   WBC 6.8 11/25/2020   HGB 13.7 11/25/2020   HCT 40.5 11/25/2020   PLT 219.0 11/25/2020   GLUCOSE 95 11/25/2020   CHOL 148 11/25/2020   TRIG 96.0 11/25/2020   HDL 59.60 11/25/2020   LDLDIRECT 80.0 06/27/2018   LDLCALC 69 11/25/2020   ALT 14 11/25/2020   AST 19 11/25/2020   NA 141 11/25/2020   K 4.0 11/25/2020   CL 107 11/25/2020   CREATININE 0.99 11/25/2020   BUN 12 11/25/2020   CO2 28 11/25/2020   TSH 2.62 11/25/2020   Assessment/Plan:  Caitlyn Schmidt is a 60 y.o. White or Caucasian [1] female with  has a past medical history of ADD (attention deficit disorder), Allergic rhinitis, mild, Essential hypertension (06/04/2015), Hyperlipidemia, IBS (irritable bowel syndrome), and Melanoma ([place]) (1995). Preventative health care Age and sex appropriate education and counseling updated with regular exercise and diet Referrals for preventative services - none needed Immunizations addressed - none needed Smoking counseling  - none needed Evidence for depression or other mood disorder - none significant Most recent labs reviewed. I have personally reviewed and have noted: 1) the patient's medical and social history 2) The patient's current medications and supplements 3) The patient's height, weight, and BMI have been recorded in the chart   Hyperlipidemia Lab Results  Component Value Date   LDLCALC 69 11/25/2020   Stable, pt to continue current statin crestor 20, consider CT cardiac score   Followup: Return in about 1 year (around 12/03/2021).  Cathlean Cower, MD 12/10/2020 8:25 PM [facility]

## 2020-12-10 ENCOUNTER — Encounter: Payer: Self-pay | Admitting: Internal Medicine

## 2020-12-10 NOTE — Assessment & Plan Note (Signed)
Lab Results  Component Value Date   LDLCALC 69 11/25/2020   Stable, pt to continue current statin crestor 20, consider CT cardiac score

## 2020-12-10 NOTE — Assessment & Plan Note (Signed)

## 2020-12-25 ENCOUNTER — Encounter: Payer: Self-pay | Admitting: Internal Medicine

## 2021-01-14 DIAGNOSIS — D225 Melanocytic nevi of trunk: Secondary | ICD-10-CM | POA: Diagnosis not present

## 2021-01-14 DIAGNOSIS — L820 Inflamed seborrheic keratosis: Secondary | ICD-10-CM | POA: Diagnosis not present

## 2021-01-14 DIAGNOSIS — L814 Other melanin hyperpigmentation: Secondary | ICD-10-CM | POA: Diagnosis not present

## 2021-01-14 DIAGNOSIS — L905 Scar conditions and fibrosis of skin: Secondary | ICD-10-CM | POA: Diagnosis not present

## 2021-01-14 DIAGNOSIS — L853 Xerosis cutis: Secondary | ICD-10-CM | POA: Diagnosis not present

## 2021-01-14 DIAGNOSIS — D485 Neoplasm of uncertain behavior of skin: Secondary | ICD-10-CM | POA: Diagnosis not present

## 2021-03-02 DIAGNOSIS — Z20822 Contact with and (suspected) exposure to covid-19: Secondary | ICD-10-CM | POA: Diagnosis not present

## 2021-10-08 ENCOUNTER — Telehealth: Payer: BC Managed Care – PPO | Admitting: Emergency Medicine

## 2021-12-04 ENCOUNTER — Other Ambulatory Visit (INDEPENDENT_AMBULATORY_CARE_PROVIDER_SITE_OTHER): Payer: BC Managed Care – PPO

## 2021-12-04 DIAGNOSIS — Z6821 Body mass index (BMI) 21.0-21.9, adult: Secondary | ICD-10-CM | POA: Diagnosis not present

## 2021-12-04 DIAGNOSIS — E785 Hyperlipidemia, unspecified: Secondary | ICD-10-CM | POA: Diagnosis not present

## 2021-12-04 DIAGNOSIS — Z124 Encounter for screening for malignant neoplasm of cervix: Secondary | ICD-10-CM | POA: Diagnosis not present

## 2021-12-04 DIAGNOSIS — Z01411 Encounter for gynecological examination (general) (routine) with abnormal findings: Secondary | ICD-10-CM | POA: Diagnosis not present

## 2021-12-04 DIAGNOSIS — Z1231 Encounter for screening mammogram for malignant neoplasm of breast: Secondary | ICD-10-CM | POA: Diagnosis not present

## 2021-12-04 DIAGNOSIS — Z01419 Encounter for gynecological examination (general) (routine) without abnormal findings: Secondary | ICD-10-CM | POA: Diagnosis not present

## 2021-12-04 DIAGNOSIS — E559 Vitamin D deficiency, unspecified: Secondary | ICD-10-CM | POA: Diagnosis not present

## 2021-12-04 LAB — BASIC METABOLIC PANEL
BUN: 11 mg/dL (ref 6–23)
CO2: 27 mEq/L (ref 19–32)
Calcium: 9.4 mg/dL (ref 8.4–10.5)
Chloride: 108 mEq/L (ref 96–112)
Creatinine, Ser: 0.95 mg/dL (ref 0.40–1.20)
GFR: 65.51 mL/min (ref >60.00)
Glucose, Bld: 95 mg/dL (ref 70–99)
Potassium: 4 mEq/L (ref 3.5–5.1)
Sodium: 142 mEq/L (ref 135–145)

## 2021-12-04 LAB — HEPATIC FUNCTION PANEL
ALT: 17 U/L (ref 0–35)
AST: 19 U/L (ref 0–37)
Albumin: 4.2 g/dL (ref 3.5–5.2)
Alkaline Phosphatase: 70 U/L (ref 39–117)
Bilirubin, Direct: 0.1 mg/dL (ref 0.0–0.3)
Total Bilirubin: 0.5 mg/dL (ref 0.2–1.2)
Total Protein: 6.8 g/dL (ref 6.0–8.3)

## 2021-12-04 LAB — URINALYSIS, ROUTINE W REFLEX MICROSCOPIC
Bilirubin Urine: NEGATIVE
Ketones, ur: NEGATIVE
Leukocytes,Ua: NEGATIVE
Nitrite: NEGATIVE
Specific Gravity, Urine: >=1.03 — AB (ref 1.000–1.030)
Total Protein, Urine: NEGATIVE
Urine Glucose: NEGATIVE
Urobilinogen, UA: 0.2 (ref 0.0–1.0)
pH: 5.5 (ref 5.0–8.0)

## 2021-12-04 LAB — LIPID PANEL
Cholesterol: 134 mg/dL (ref 0–200)
HDL: 62.7 mg/dL (ref >39.00)
LDL Cholesterol: 53 mg/dL (ref 0–99)
NonHDL: 71.16
Total CHOL/HDL Ratio: 2
Triglycerides: 93 mg/dL (ref 0.0–149.0)
VLDL: 18.6 mg/dL (ref 0.0–40.0)

## 2021-12-04 LAB — CBC WITH DIFFERENTIAL/PLATELET
Basophils Absolute: 0.1 10*3/uL (ref 0.0–0.1)
Basophils Relative: 1 % (ref 0.0–3.0)
Eosinophils Absolute: 0.2 10*3/uL (ref 0.0–0.7)
Eosinophils Relative: 3.3 % (ref 0.0–5.0)
HCT: 41.7 % (ref 36.0–46.0)
Hemoglobin: 14 g/dL (ref 12.0–15.0)
Lymphocytes Relative: 24.1 % (ref 12.0–46.0)
Lymphs Abs: 1.5 10*3/uL (ref 0.7–4.0)
MCHC: 33.7 g/dL (ref 30.0–36.0)
MCV: 86.8 fl (ref 78.0–100.0)
Monocytes Absolute: 0.5 10*3/uL (ref 0.1–1.0)
Monocytes Relative: 7.7 % (ref 3.0–12.0)
Neutro Abs: 4 10*3/uL (ref 1.4–7.7)
Neutrophils Relative %: 63.9 % (ref 43.0–77.0)
Platelets: 219 10*3/uL (ref 150.0–400.0)
RBC: 4.81 Mil/uL (ref 3.87–5.11)
RDW: 12.2 % (ref 11.5–15.5)
WBC: 6.3 10*3/uL (ref 4.0–10.5)

## 2021-12-04 LAB — TSH: TSH: 2.76 u[IU]/mL (ref 0.35–5.50)

## 2021-12-04 LAB — VITAMIN D 25 HYDROXY (VIT D DEFICIENCY, FRACTURES): VITD: 60.87 ng/mL (ref 30.00–100.00)

## 2021-12-07 ENCOUNTER — Encounter: Payer: BC Managed Care – PPO | Admitting: Internal Medicine

## 2021-12-24 ENCOUNTER — Other Ambulatory Visit: Payer: Self-pay

## 2021-12-24 ENCOUNTER — Encounter: Payer: Self-pay | Admitting: Internal Medicine

## 2021-12-24 ENCOUNTER — Ambulatory Visit (INDEPENDENT_AMBULATORY_CARE_PROVIDER_SITE_OTHER): Payer: BC Managed Care – PPO | Admitting: Internal Medicine

## 2021-12-24 VITALS — BP 110/66 | HR 72 | Temp 98.4°F | Ht 69.0 in | Wt 149.0 lb

## 2021-12-24 DIAGNOSIS — K645 Perianal venous thrombosis: Secondary | ICD-10-CM | POA: Diagnosis not present

## 2021-12-24 DIAGNOSIS — Z0001 Encounter for general adult medical examination with abnormal findings: Secondary | ICD-10-CM

## 2021-12-24 DIAGNOSIS — E538 Deficiency of other specified B group vitamins: Secondary | ICD-10-CM

## 2021-12-24 DIAGNOSIS — E2839 Other primary ovarian failure: Secondary | ICD-10-CM | POA: Diagnosis not present

## 2021-12-24 DIAGNOSIS — R030 Elevated blood-pressure reading, without diagnosis of hypertension: Secondary | ICD-10-CM

## 2021-12-24 DIAGNOSIS — E559 Vitamin D deficiency, unspecified: Secondary | ICD-10-CM

## 2021-12-24 DIAGNOSIS — Z0000 Encounter for general adult medical examination without abnormal findings: Secondary | ICD-10-CM | POA: Insufficient documentation

## 2021-12-24 DIAGNOSIS — E785 Hyperlipidemia, unspecified: Secondary | ICD-10-CM

## 2021-12-24 MED ORDER — ROSUVASTATIN CALCIUM 20 MG PO TABS: 20.0000 mg | ORAL_TABLET | Freq: Every day | ORAL | 3 refills | Status: DC

## 2021-12-24 MED ORDER — HYDROCORTISONE (PERIANAL) 2.5 % EX CREA: 1.0000 "application " | TOPICAL_CREAM | Freq: Two times a day (BID) | CUTANEOUS | 0 refills | Status: DC

## 2021-12-24 NOTE — Assessment & Plan Note (Signed)
BP Readings from Last 3 Encounters:  ?12/24/21 110/66  ?12/03/20 108/70  ?06/27/18 106/70  ? ?Stable, pt to continue medical treatment  - diet low salt, wt control ? ?

## 2021-12-24 NOTE — Assessment & Plan Note (Signed)
Mild to mod, for anusol hc prn, to f/u any worsening symptoms or concerns ?

## 2021-12-24 NOTE — Progress Notes (Signed)
Patient ID: [name], female   DOB: 1961/05/09, 61 y.o.   MRN: [id number] ? ? ? ?     Chief Complaint:: wellness exam and premature menopause, external hemorrhoid and hld ? ?     HPI:  Caitlyn Schmidt is a 61 y.o. female here for wellness exam; has hx of early menopause at 14; due for DXA; declines covid booster, flu shot, o/w up to date ?         ?              Also c/o 1 wk onset ext hemorrhoid pain without bleeding but pain still about 5/10 and hard to sit for work; has hx of similar years ago; Denies worsening reflux, abd pain, dysphagia, n/v, bowel change or blood.  Pt denies chest pain, increased sob or doe, wheezing, orthopnea, PND, increased LE swelling, palpitations, dizziness or syncope.   Pt denies polydipsia, polyuria, or new focal neuro s/s.    Pt denies fever, wt loss, night sweats, loss of appetite, or other constitutional symptoms  Still taking statin, and would like to have card CT score testing to see if she can stop.   ?  ?Wt Readings from Last 3 Encounters:  ?12/24/21 149 lb (67.6 kg)  ?12/03/20 154 lb (69.9 kg)  ?10/27/20 152 lb (68.9 kg)  ? ?BP Readings from Last 3 Encounters:  ?12/24/21 110/66  ?12/03/20 108/70  ?06/27/18 106/70  ? ?Immunization History  ?Administered Date(s) Administered  ? Influenza,inj,Quad PF,6+ Mos 07/01/2017, 07/20/2019, 08/16/2020  ? PFIZER(Purple Top)SARS-COV-2 Vaccination 11/05/2019, 11/26/2019, 09/20/2020  ? Td 10/25/1998  ? Tdap 06/04/2015  ? Zoster Recombinat (Shingrix) 02/22/2018, 09/07/2018  ? ?There are no preventive care reminders to display for this patient. ? ?  ? ?Past Medical History:  ?Diagnosis Date  ? ADD (attention deficit disorder)   ? Allergic rhinitis, mild   ? Essential hypertension 06/04/2015  ? Hyperlipidemia   ? IBS (irritable bowel syndrome)   ? Melanoma ([place]) 1995  ? ?Past Surgical History:  ?Procedure Laterality Date  ? COLPOSCOPY    ? WRIST SURGERY  2010  ? ? reports that she has never smoked. She has never used smokeless tobacco. She  reports current alcohol use. She reports that she does not use drugs. ?family history includes Cancer in her maternal grandmother, mother, and paternal grandfather; Diabetes in her maternal grandfather and maternal grandmother; Gout in her maternal grandfather; Heart disease in her maternal grandfather, paternal grandfather, and paternal grandmother; Hyperlipidemia in her father; Hypertension in her father, maternal grandmother, and mother; Kidney disease in her maternal grandfather; Macular degeneration in her maternal grandmother; Osteoporosis in her maternal grandmother; Thyroid disease in her mother. ?Allergies  ?Allergen Reactions  ? Prednisone   ?  REACTION: Jittery  ? ?Current Outpatient Medications on File Prior to Visit  ?Medication Sig Dispense Refill  ? ASHWAGANDHA PO Take 2,000 mg by mouth daily in the afternoon.    ? Calcium-Vitamin D-Vitamin K (CALCIUM SOFT CHEWS PO) Take by mouth.    ? cholecalciferol (VITAMIN D) 1000 UNITS tablet Take 2,000 Units by mouth daily.    ? fluticasone (FLONASE) 50 MCG/ACT nasal spray Place 2 sprays into both nostrils daily. 16 g 6  ? Multiple Vitamin (MULTIVITAMIN) tablet Take 1 tablet by mouth daily.    ? Omega-3 Fatty Acids (FISH OIL OMEGA-3 PO) Take 1,400 mg by mouth daily in the afternoon.    ? ?No current facility-administered medications on file prior to visit.  ? ?  ROS:  All others reviewed and negative. ? ?Objective  ? ?     PE:  BP 110/66 (BP Location: Left Arm, Patient Position: Sitting, Cuff Size: Large)   Pulse 72   Temp 98.4 ?F (36.9 ?C) (Oral)   Ht 5\' 9"  (1.753 m)   Wt 149 lb (67.6 kg)   SpO2 97%   BMI 22.00 kg/m?  ? ?              Constitutional: Pt appears in NAD ?              HENT: Head: NCAT.  ?              Right Ear: External ear normal.   ?              Left Ear: External ear normal.  ?              Eyes: . Pupils are equal, round, and reactive to light. Conjunctivae and EOM are normal ?              Nose: without d/c or deformity ?               Neck: Neck supple. Gross normal ROM ?              Cardiovascular: Normal rate and regular rhythm.   ?              Pulmonary/Chest: Effort normal and breath sounds without rales or wheezing.  ?              Abd:  Soft, NT, ND, + BS, no organomegaly ?              Neurological: Pt is alert. At baseline orientation, motor grossly intact ?              Skin: Skin is warm. No rashes, no other new lesions, LE edema - none ?              Psychiatric: Pt behavior is normal without agitation  ? ?Micro: none ? ?Cardiac tracings I have personally interpreted today:  none ? ?Pertinent Radiological findings (summarize): none  ? ?Lab Results  ?Component Value Date  ? WBC 6.3 12/04/2021  ? HGB 14.0 12/04/2021  ? HCT 41.7 12/04/2021  ? PLT 219.0 12/04/2021  ? GLUCOSE 95 12/04/2021  ? CHOL 134 12/04/2021  ? TRIG 93.0 12/04/2021  ? HDL 62.70 12/04/2021  ? LDLDIRECT 80.0 06/27/2018  ? [place] 53 12/04/2021  ? ALT 17 12/04/2021  ? AST 19 12/04/2021  ? NA 142 12/04/2021  ? K 4.0 12/04/2021  ? CL 108 12/04/2021  ? CREATININE 0.95 12/04/2021  ? BUN 11 12/04/2021  ? CO2 27 12/04/2021  ? TSH 2.76 12/04/2021  ? ?Assessment/Plan:  ?Caitlyn Schmidt is a 61 y.o. White or Caucasian [1] female with  has a past medical history of ADD (attention deficit disorder), Allergic rhinitis, mild, Essential hypertension (06/04/2015), Hyperlipidemia, IBS (irritable bowel syndrome), and Melanoma ([place]) (1995). ? ?Blood pressure elevated without history of HTN ?BP Readings from Last 3 Encounters:  ?12/24/21 110/66  ?12/03/20 108/70  ?06/27/18 106/70  ? ?Stable, pt to continue medical treatment  - diet low salt, wt control ? ? ?Encounter for well adult exam with abnormal findings ?Age and sex appropriate education and counseling updated with regular exercise and diet ?Referrals for preventative services - due for DXA ?Immunizations addressed - declines covid booster and flu  shot ?Smoking counseling  - none needed ?Evidence for depression or other mood  disorder - none significant ?Most recent labs reviewed. ?I have personally reviewed and have noted: ?1) the patient's medical and social history ?2) The patient's current medications and supplements ?3) The patient's height, weight, and BMI have been recorded in the chart ? ? ?Hyperlipidemia ?Lab Results  ?Component Value Date  ? [place] 53 12/04/2021  ? ?Stable, pt to continue current statin crestor, but check card Ct score - if zero then can stop the crestor ? ? ?External hemorrhoid, thrombosed ?Mild to mod, for anusol hc prn, to f/u any worsening symptoms or concerns ? ?Estrogen deficiency ?With early menopause- for dxa ? ?Followup: Return in about 1 year (around 12/25/2022). ? ?Cathlean Cower, MD 12/24/2021 10:03 AM ?[facility] ?[place] ?Internal Medicine ?

## 2021-12-24 NOTE — Assessment & Plan Note (Signed)
Lab Results  ?Component Value Date  ? [place] 53 12/04/2021  ? ?Stable, pt to continue current statin crestor, but check card Ct score - if zero then can stop the crestor ? ?

## 2021-12-24 NOTE — Assessment & Plan Note (Signed)
With early menopause- for dxa ?

## 2021-12-24 NOTE — Assessment & Plan Note (Signed)
Age and sex appropriate education and counseling updated with regular exercise and diet ?Referrals for preventative services - due for DXA ?Immunizations addressed - declines covid booster and flu shot ?Smoking counseling  - none needed ?Evidence for depression or other mood disorder - none significant ?Most recent labs reviewed. ?I have personally reviewed and have noted: ?1) the patient's medical and social history ?2) The patient's current medications and supplements ?3) The patient's height, weight, and BMI have been recorded in the chart ? ?

## 2021-12-24 NOTE — Patient Instructions (Signed)
We have discussed the Cardiac CT Score test to measure the calcification level (if any) in your heart arteries.  This test has been ordered in our [place], so please call [facility] directly, as they prefer this, at [phone] to be scheduled. ? ?Please schedule the bone density test before leaving today at the scheduling desk (where you check out) ? ?Please take all new medication as prescribed - the cream ? ?Please continue all other medications as before, and refills have been done if requested. ? ?Please have the pharmacy call with any other refills you may need. ? ?Please continue your efforts at being more active, low cholesterol diet, and weight control. ? ?You are otherwise up to date with prevention measures today. ? ?Please keep your appointments with your specialists as you may have planned ? ?Please make an Appointment to return for your 1 year visit, or sooner if needed, with Lab testing by Appointment as well, to be done about 3-5 days before at the [facility] (so this is for TWO appointments - please see the scheduling desk as you leave) ? ? ?Due to the ongoing Covid 19 pandemic, our lab now requires an appointment for any labs done at our office.  If you need labs done and do not have an appointment, please call our office ahead of time to schedule before presenting to the lab for your testing. ? ?

## 2022-01-22 ENCOUNTER — Inpatient Hospital Stay: Admission: RE | Admit: 2022-01-22 | Payer: BC Managed Care – PPO | Source: Ambulatory Visit

## 2022-01-29 ENCOUNTER — Other Ambulatory Visit: Payer: BC Managed Care – PPO

## 2022-02-05 ENCOUNTER — Inpatient Hospital Stay: Admission: RE | Admit: 2022-02-05 | Payer: BC Managed Care – PPO | Source: Ambulatory Visit

## 2022-02-05 ENCOUNTER — Other Ambulatory Visit (HOSPITAL_COMMUNITY): Payer: BC Managed Care – PPO

## 2022-02-23 DIAGNOSIS — Z85820 Personal history of malignant melanoma of skin: Secondary | ICD-10-CM | POA: Diagnosis not present

## 2022-02-23 DIAGNOSIS — L439 Lichen planus, unspecified: Secondary | ICD-10-CM | POA: Diagnosis not present

## 2022-02-23 DIAGNOSIS — L570 Actinic keratosis: Secondary | ICD-10-CM | POA: Diagnosis not present

## 2022-02-23 DIAGNOSIS — L814 Other melanin hyperpigmentation: Secondary | ICD-10-CM | POA: Diagnosis not present

## 2022-02-23 DIAGNOSIS — D225 Melanocytic nevi of trunk: Secondary | ICD-10-CM | POA: Diagnosis not present

## 2022-02-23 DIAGNOSIS — L821 Other seborrheic keratosis: Secondary | ICD-10-CM | POA: Diagnosis not present

## 2022-02-23 DIAGNOSIS — D492 Neoplasm of unspecified behavior of bone, soft tissue, and skin: Secondary | ICD-10-CM | POA: Diagnosis not present

## 2022-03-11 ENCOUNTER — Ambulatory Visit (INDEPENDENT_AMBULATORY_CARE_PROVIDER_SITE_OTHER)
Admission: RE | Admit: 2022-03-11 | Discharge: 2022-03-11 | Disposition: A | Discharge status: Home / Self Care | Payer: Self-pay | Source: Ambulatory Visit | Attending: Internal Medicine | Admitting: Internal Medicine

## 2022-03-11 DIAGNOSIS — E785 Hyperlipidemia, unspecified: Secondary | ICD-10-CM

## 2022-08-26 DIAGNOSIS — L814 Other melanin hyperpigmentation: Secondary | ICD-10-CM | POA: Diagnosis not present

## 2022-08-26 DIAGNOSIS — D492 Neoplasm of unspecified behavior of bone, soft tissue, and skin: Secondary | ICD-10-CM | POA: Diagnosis not present

## 2022-08-26 DIAGNOSIS — L821 Other seborrheic keratosis: Secondary | ICD-10-CM | POA: Diagnosis not present

## 2022-08-26 DIAGNOSIS — Z85820 Personal history of malignant melanoma of skin: Secondary | ICD-10-CM | POA: Diagnosis not present

## 2022-08-26 DIAGNOSIS — L538 Other specified erythematous conditions: Secondary | ICD-10-CM | POA: Diagnosis not present

## 2022-08-26 DIAGNOSIS — D225 Melanocytic nevi of trunk: Secondary | ICD-10-CM | POA: Diagnosis not present

## 2022-10-19 IMAGING — CT CT CARDIAC CORONARY ARTERY CALCIUM SCORE
3 series · 14 of 20 positions shown, 16 images · non-contrast
Comparison: None Available.
COMPARISON: None Available.

Addendum:
CLINICAL DATA: This over-read does not include interpretation of
cardiac or coronary anatomy or pathology. The coronary calcium score
interpretation by the cardiologist is attached.
CLINICAL DATA: Risk stratification: 60 Year-old White Female

EXAM:
Coronary Calcium Score
TECHNIQUE: The patient was scanned on a Siemens Force scanner. Axial
non-contrast 3 mm slices were carried out through the heart. The
data set was analyzed on a dedicated work station and scored using
the Agatson method.

[Series 2: cascseq 2.0 sa36 70% (id) · axial · 0.39mm/px · z∈[-198,-108]mm · 4 of 76 slices shown]
[im 16/76  vessel]
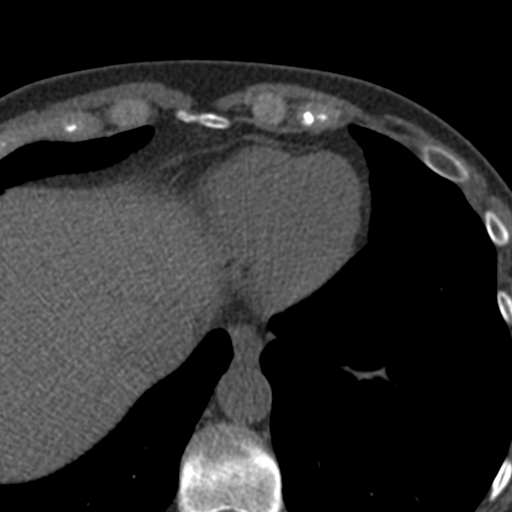
[im 31/76  vessel]
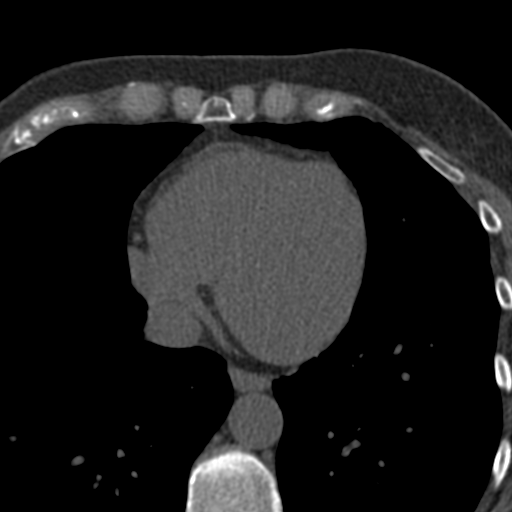
[im 46/76  vessel]
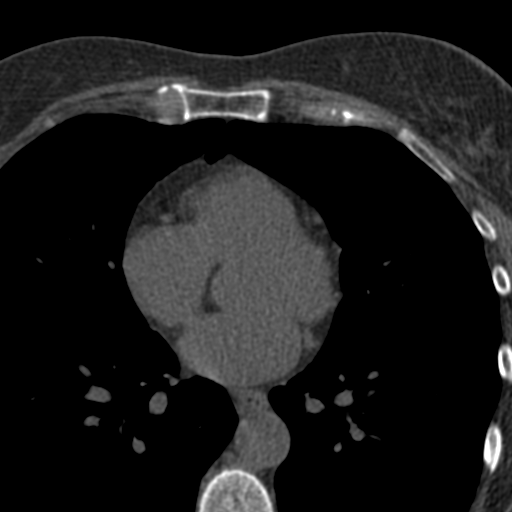
[im 61/76  vessel]
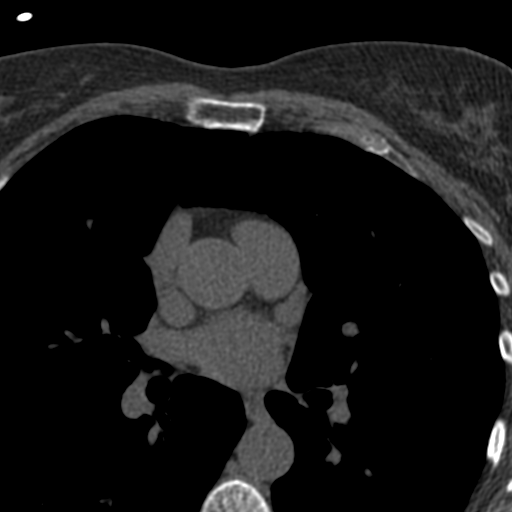

[Series 3: cascseq 2.0 bf37 st · axial · 0.65mm/px · z∈[-204,-104]mm · 5 of 76 slices shown, 7 images]
[im 13/76  vessel]
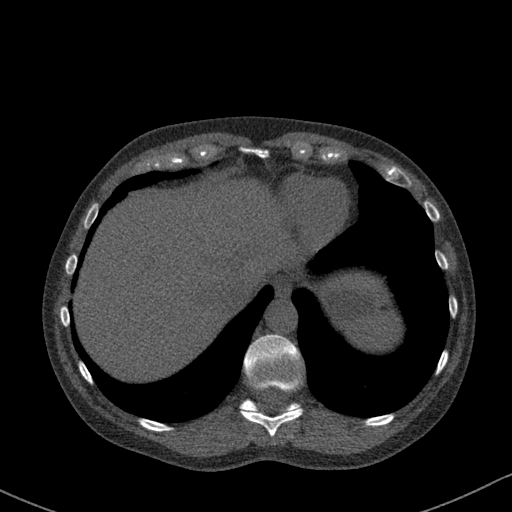
[im 13/76  lung]
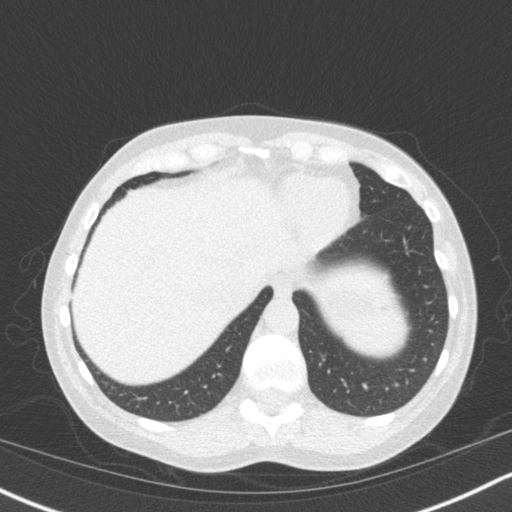
[im 26/76  vessel]
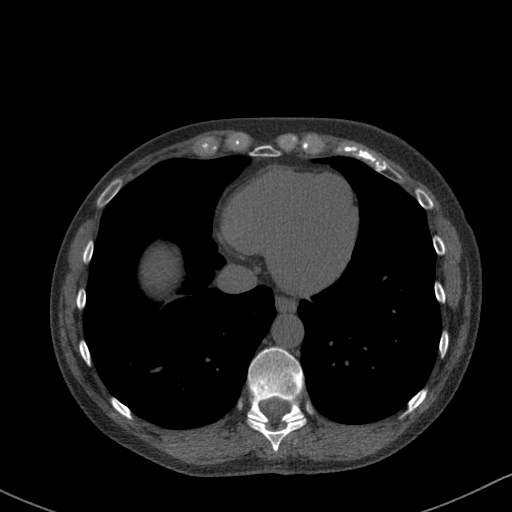
[im 38/76  vessel]
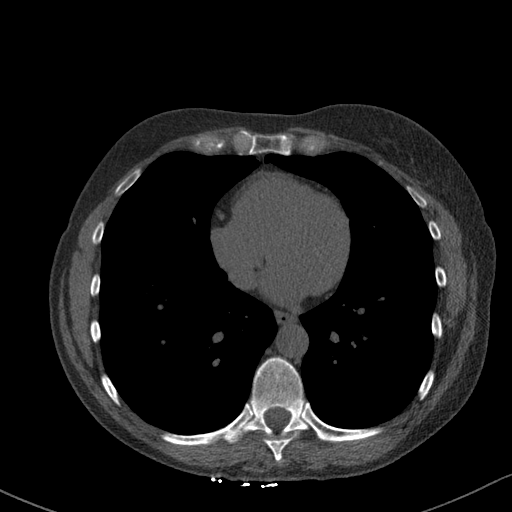
[im 51/76  vessel]
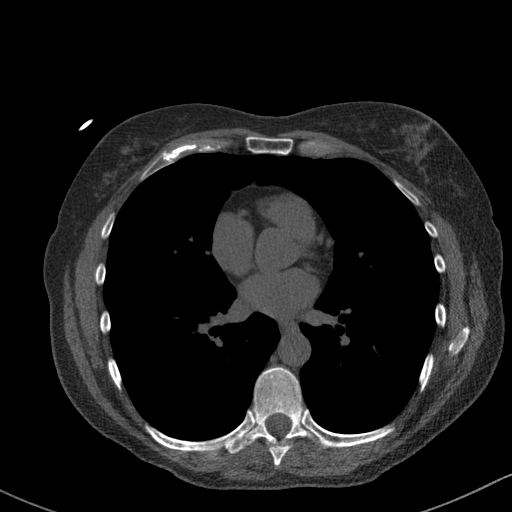
[im 63/76  vessel]
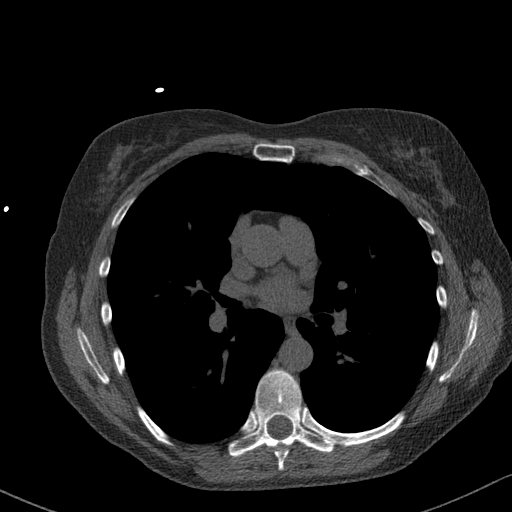
[im 63/76  lung]
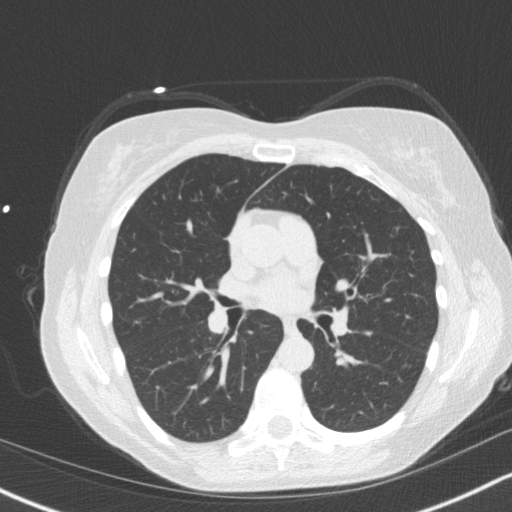

[Series 4: cascseq 2.0 br59 lung · axial · 0.65mm/px · z∈[-204,-104]mm · 5 of 76 slices shown]
[im 13/76  lung]
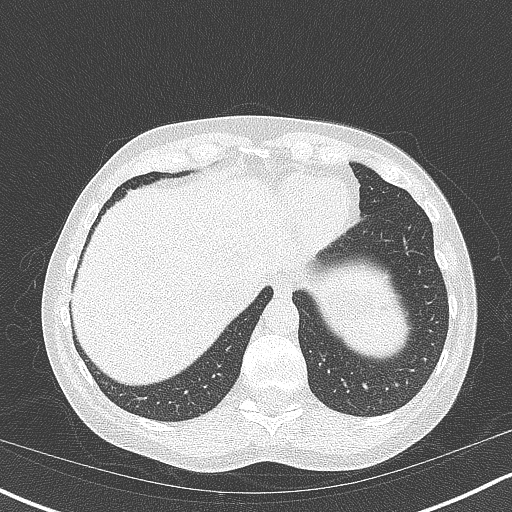
[im 26/76  lung]
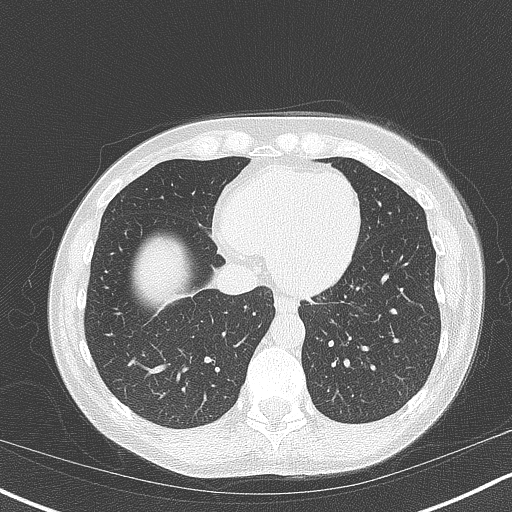
[im 38/76  lung]
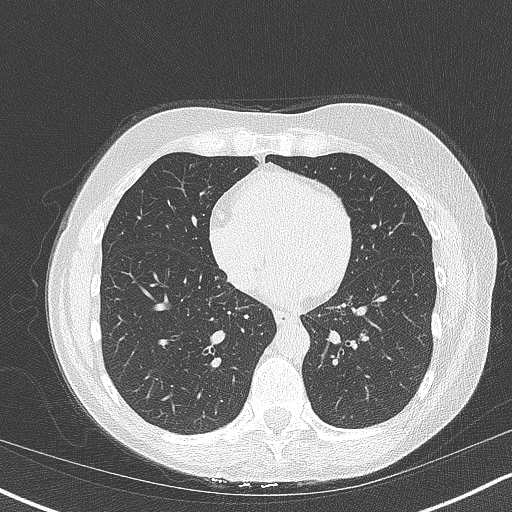
[im 51/76  lung]
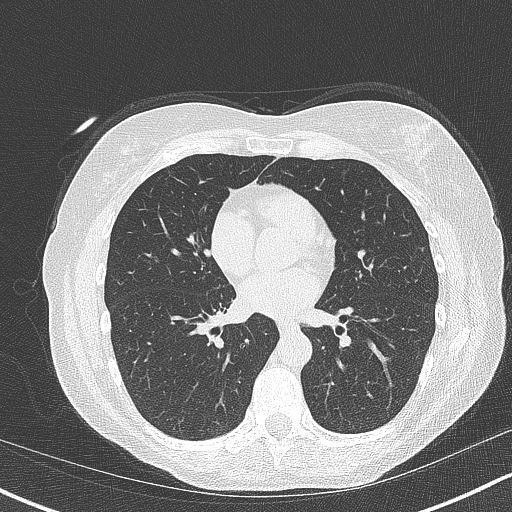
[im 63/76  lung]
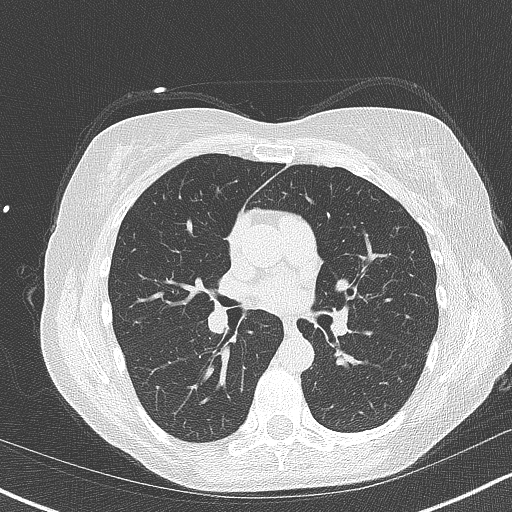

[14 of 20 positions shown; findings below may reference images not displayed]

FINDINGS: Within the visualized portions of the thorax there are no suspicious
appearing pulmonary nodules or masses, there is no acute
consolidative airspace disease, no pleural effusions, no
pneumothorax and no lymphadenopathy. Visualized portions of the
upper abdomen are unremarkable. There are no aggressive appearing
lytic or blastic lesions noted in the visualized portions of the
skeleton.
IMPRESSION: 1. No significant incidental noncardiac findings are noted.
FINDINGS: Non-cardiac: See separate report from [REDACTED].

Ascending Aorta: Normal caliber.

Pericardium: Normal.

Coronary arteries: Normal origins.

Coronary Calcium Score:

Left main: 0

Left anterior descending artery: 0

Left circumflex artery: 0

Right coronary artery: 0

Total: 0

Percentile: 1st for age, sex, and race matched control.
IMPRESSION: 1. Coronary calcium score of 0. This was 1st percentile for age,
gender, and race matched controls.

RECOMMENDATIONS:



If CAC = 0, it is reasonable to withhold statin therapy and reassess
in 5 to 10 years, as long as higher risk conditions are absent
(diabetes mellitus, family history of premature CHD in first degree
relatives (males <55 years; females <65 years), cigarette smoking,
LDL >=190 mg/dL or other independent risk factors).

If CAC is 1 to 99, it is reasonable to initiate statin therapy for
patients >=55 years of age.

If CAC is >=100 or >=75th percentile, it is reasonable to initiate
statin therapy at any age.

Cardiology referral should be considered for patients with CAC
scores =400 or >=75th percentile.

*5065 AHA/ACC/AACVPR/AAPA/ABC/FE/ONEYDA/ERXLEBEN/Peralta Delgado/YOSUF/YENI/MOGZAURI
Guideline on the Management of Blood Cholesterol: A Report of the
American College of Cardiology/American Heart Association Task Force
on Clinical Practice Guidelines. J Am Coll Cardiol.
1784;73(24):5869-5254.

*** End of Addendum ***
FINDINGS: Within the visualized portions of the thorax there are no suspicious
appearing pulmonary nodules or masses, there is no acute
consolidative airspace disease, no pleural effusions, no
pneumothorax and no lymphadenopathy. Visualized portions of the
upper abdomen are unremarkable. There are no aggressive appearing
lytic or blastic lesions noted in the visualized portions of the
skeleton.
IMPRESSION: 1. No significant incidental noncardiac findings are noted.

## 2022-12-15 DIAGNOSIS — Z01419 Encounter for gynecological examination (general) (routine) without abnormal findings: Secondary | ICD-10-CM | POA: Diagnosis not present

## 2022-12-15 DIAGNOSIS — Z1231 Encounter for screening mammogram for malignant neoplasm of breast: Secondary | ICD-10-CM | POA: Diagnosis not present

## 2022-12-15 DIAGNOSIS — Z6821 Body mass index (BMI) 21.0-21.9, adult: Secondary | ICD-10-CM | POA: Diagnosis not present

## 2022-12-15 LAB — HM MAMMOGRAPHY

## 2022-12-23 ENCOUNTER — Other Ambulatory Visit (INDEPENDENT_AMBULATORY_CARE_PROVIDER_SITE_OTHER): Payer: BC Managed Care – PPO

## 2022-12-23 DIAGNOSIS — E559 Vitamin D deficiency, unspecified: Secondary | ICD-10-CM | POA: Diagnosis not present

## 2022-12-23 DIAGNOSIS — E538 Deficiency of other specified B group vitamins: Secondary | ICD-10-CM

## 2022-12-23 DIAGNOSIS — E785 Hyperlipidemia, unspecified: Secondary | ICD-10-CM | POA: Diagnosis not present

## 2022-12-23 LAB — CBC WITH DIFFERENTIAL/PLATELET
Basophils Absolute: 0.1 10*3/uL (ref 0.0–0.1)
Basophils Relative: 1 % (ref 0.0–3.0)
Eosinophils Absolute: 0.3 10*3/uL (ref 0.0–0.7)
Eosinophils Relative: 4.1 % (ref 0.0–5.0)
HCT: 43.1 % (ref 36.0–46.0)
Hemoglobin: 14.3 g/dL (ref 12.0–15.0)
Lymphocytes Relative: 23.4 % (ref 12.0–46.0)
Lymphs Abs: 1.6 10*3/uL (ref 0.7–4.0)
MCHC: 33.1 g/dL (ref 30.0–36.0)
MCV: 89.6 fl (ref 78.0–100.0)
Monocytes Absolute: 0.6 10*3/uL (ref 0.1–1.0)
Monocytes Relative: 8.3 % (ref 3.0–12.0)
Neutro Abs: 4.2 10*3/uL (ref 1.4–7.7)
Neutrophils Relative %: 63.2 % (ref 43.0–77.0)
Platelets: 211 10*3/uL (ref 150.0–400.0)
RBC: 4.81 Mil/uL (ref 3.87–5.11)
RDW: 12.5 % (ref 11.5–15.5)
WBC: 6.7 10*3/uL (ref 4.0–10.5)

## 2022-12-23 LAB — BASIC METABOLIC PANEL
BUN: 16 mg/dL (ref 6–23)
CO2: 28 mEq/L (ref 19–32)
Calcium: 9.6 mg/dL (ref 8.4–10.5)
Chloride: 106 mEq/L (ref 96–112)
Creatinine, Ser: 1.06 mg/dL (ref 0.40–1.20)
GFR: 57.02 mL/min — ABNORMAL LOW (ref >60.00)
Glucose, Bld: 88 mg/dL (ref 70–99)
Potassium: 4 mEq/L (ref 3.5–5.1)
Sodium: 142 mEq/L (ref 135–145)

## 2022-12-23 LAB — URINALYSIS, ROUTINE W REFLEX MICROSCOPIC
Bilirubin Urine: NEGATIVE
Hgb urine dipstick: NEGATIVE
Ketones, ur: NEGATIVE
Leukocytes,Ua: NEGATIVE
Nitrite: NEGATIVE
Specific Gravity, Urine: 1.025 (ref 1.000–1.030)
Total Protein, Urine: NEGATIVE
Urine Glucose: NEGATIVE
Urobilinogen, UA: 0.2 (ref 0.0–1.0)
pH: 6 (ref 5.0–8.0)

## 2022-12-23 LAB — HEPATIC FUNCTION PANEL
ALT: 15 U/L (ref 0–35)
AST: 20 U/L (ref 0–37)
Albumin: 4.2 g/dL (ref 3.5–5.2)
Alkaline Phosphatase: 71 U/L (ref 39–117)
Bilirubin, Direct: 0.1 mg/dL (ref 0.0–0.3)
Total Bilirubin: 0.6 mg/dL (ref 0.2–1.2)
Total Protein: 6.8 g/dL (ref 6.0–8.3)

## 2022-12-23 LAB — VITAMIN B12: Vitamin B-12: 686 pg/mL (ref 211–911)

## 2022-12-23 LAB — LIPID PANEL
Cholesterol: 147 mg/dL (ref 0–200)
HDL: 65.4 mg/dL (ref >39.00)
LDL Cholesterol: 59 mg/dL (ref 0–99)
NonHDL: 81.29
Total CHOL/HDL Ratio: 2
Triglycerides: 110 mg/dL (ref 0.0–149.0)
VLDL: 22 mg/dL (ref 0.0–40.0)

## 2022-12-23 LAB — TSH: TSH: 2.09 u[IU]/mL (ref 0.35–5.50)

## 2022-12-23 LAB — VITAMIN D 25 HYDROXY (VIT D DEFICIENCY, FRACTURES): VITD: 54.64 ng/mL (ref 30.00–100.00)

## 2022-12-24 ENCOUNTER — Other Ambulatory Visit: Payer: BC Managed Care – PPO

## 2022-12-29 ENCOUNTER — Ambulatory Visit (INDEPENDENT_AMBULATORY_CARE_PROVIDER_SITE_OTHER): Payer: BC Managed Care – PPO | Admitting: Internal Medicine

## 2022-12-29 VITALS — BP 118/66 | HR 74 | Temp 98.0°F | Ht 69.0 in | Wt 150.0 lb

## 2022-12-29 DIAGNOSIS — E785 Hyperlipidemia, unspecified: Secondary | ICD-10-CM

## 2022-12-29 DIAGNOSIS — R739 Hyperglycemia, unspecified: Secondary | ICD-10-CM | POA: Diagnosis not present

## 2022-12-29 DIAGNOSIS — E559 Vitamin D deficiency, unspecified: Secondary | ICD-10-CM | POA: Diagnosis not present

## 2022-12-29 DIAGNOSIS — E538 Deficiency of other specified B group vitamins: Secondary | ICD-10-CM

## 2022-12-29 DIAGNOSIS — Z0000 Encounter for general adult medical examination without abnormal findings: Secondary | ICD-10-CM | POA: Diagnosis not present

## 2022-12-29 DIAGNOSIS — Z0001 Encounter for general adult medical examination with abnormal findings: Secondary | ICD-10-CM

## 2022-12-29 MED ORDER — ROSUVASTATIN CALCIUM 20 MG PO TABS: 20.0000 mg | ORAL_TABLET | Freq: Every day | ORAL | 3 refills | Status: DC

## 2022-12-29 NOTE — Progress Notes (Signed)
Patient ID: Caitlyn Schmidt, female   DOB: Jan 21, 1961, 62 y.o.   MRN: [id number]         Chief Complaint:: wellness exam       HPI:  Caitlyn Schmidt is a 62 y.o. female here for wellness exam; due for f/u DxA later this month;  declines covid booster or flu shot, o/w up to date                 Also Pt denies chest pain, increased sob or doe, wheezing, orthopnea, PND, increased LE swelling, palpitations, dizziness or syncope.   Pt denies polydipsia, polyuria, or new focal neuro s/s.   Pt denies fever, wt loss, night sweats, loss of appetite, or other constitutional symptoms  No other new complaints   Wt Readings from Last 3 Encounters:  12/29/22 150 lb (68 kg)  12/24/21 149 lb (67.6 kg)  12/03/20 154 lb (69.9 kg)   BP Readings from Last 3 Encounters:  12/29/22 118/66  12/24/21 110/66  12/03/20 108/70   Immunization History  Administered Date(s) Administered   Influenza,inj,Quad PF,6+ Mos 07/01/2017, 07/20/2019, 08/16/2020   PFIZER(Purple Top)SARS-COV-2 Vaccination 11/05/2019, 11/26/2019, 09/20/2020   Td 10/25/1998   Tdap 06/04/2015   Zoster Recombinat (Shingrix) 02/22/2018, 09/07/2018  There are no preventive care reminders to display for this patient.    Past Medical History:  Diagnosis Date   ADD (attention deficit disorder)    Allergic rhinitis, mild    Essential hypertension 06/04/2015   Hyperlipidemia    IBS (irritable bowel syndrome)    Melanoma ([place]) 1995   Past Surgical History:  Procedure Laterality Date   COLPOSCOPY     WRIST SURGERY  2010    reports that she has never smoked. She has never used smokeless tobacco. She reports current alcohol use. She reports that she does not use drugs. family history includes Cancer in her maternal grandmother, mother, and paternal grandfather; Diabetes in her maternal grandfather and maternal grandmother; Gout in her maternal grandfather; Heart disease in her maternal grandfather, paternal grandfather, and paternal grandmother;  Hyperlipidemia in her father; Hypertension in her father, maternal grandmother, and mother; Kidney disease in her maternal grandfather; Macular degeneration in her maternal grandmother; Osteoporosis in her maternal grandmother; Thyroid disease in her mother. Allergies  Allergen Reactions   Prednisone     REACTION: Jittery   Current Outpatient Medications on File Prior to Visit  Medication Sig Dispense Refill   ASHWAGANDHA PO Take 2,000 mg by mouth daily in the afternoon.     Calcium-Vitamin D-Vitamin K (CALCIUM SOFT CHEWS PO) Take by mouth.     cholecalciferol (VITAMIN D) 1000 UNITS tablet Take 2,000 Units by mouth daily.     Multiple Vitamin (MULTIVITAMIN) tablet Take 1 tablet by mouth daily.     Omega-3 Fatty Acids (FISH OIL OMEGA-3 PO) Take 1,400 mg by mouth daily in the afternoon.     No current facility-administered medications on file prior to visit.        ROS:  All others reviewed and negative.  Objective        PE:  BP 118/66 (BP Location: Right Arm, Patient Position: Sitting, Cuff Size: Normal)   Pulse 74   Temp 98 F (36.7 C) (Oral)   Ht '5\' 9"'$  (1.753 m)   Wt 150 lb (68 kg)   SpO2 95%   BMI 22.15 kg/m                 Constitutional: Pt appears in NAD  HENT: Head: NCAT.                Right Ear: External ear normal.                 Left Ear: External ear normal.                Eyes: . Pupils are equal, round, and reactive to light. Conjunctivae and EOM are normal               Nose: without d/c or deformity               Neck: Neck supple. Gross normal ROM               Cardiovascular: Normal rate and regular rhythm.                 Pulmonary/Chest: Effort normal and breath sounds without rales or wheezing.                Abd:  Soft, NT, ND, + BS, no organomegaly               Neurological: Pt is alert. At baseline orientation, motor grossly intact               Skin: Skin is warm. No rashes, no other new lesions, LE edema - none                Psychiatric: Pt behavior is normal without agitation   Micro: none  Cardiac tracings I have personally interpreted today:  none  Pertinent Radiological findings (summarize): none   Lab Results  Component Value Date   WBC 6.7 12/23/2022   HGB 14.3 12/23/2022   HCT 43.1 12/23/2022   PLT 211.0 12/23/2022   GLUCOSE 88 12/23/2022   CHOL 147 12/23/2022   TRIG 110.0 12/23/2022   HDL 65.40 12/23/2022   LDLDIRECT 80.0 06/27/2018   LDLCALC 59 12/23/2022   ALT 15 12/23/2022   AST 20 12/23/2022   NA 142 12/23/2022   K 4.0 12/23/2022   CL 106 12/23/2022   CREATININE 1.06 12/23/2022   BUN 16 12/23/2022   CO2 28 12/23/2022   TSH 2.09 12/23/2022   Assessment/Plan:  Caitlyn Schmidt is a 62 y.o. White or Caucasian [1] female with  has a past medical history of ADD (attention deficit disorder), Allergic rhinitis, mild, Essential hypertension (06/04/2015), Hyperlipidemia, IBS (irritable bowel syndrome), and Melanoma ([place]) (1995).  Preventative health care Age and sex appropriate education and counseling updated with regular exercise and diet Referrals for preventative services - for f/u DXA later this month with GYN Immunizations addressed - declines covid booster, flu shot Smoking counseling  - none needed Evidence for depression or other mood disorder - none significant Most recent labs reviewed. I have personally reviewed and have noted: 1) the patient's medical and social history 2) The patient's current medications and supplements 3) The patient's height, weight, and BMI have been recorded in the chart  Followup: Return in about 1 year (around 12/29/2023).  Cathlean Cower, MD 12/31/2022 8:52 PM [facility]

## 2022-12-29 NOTE — Patient Instructions (Signed)

## 2022-12-31 ENCOUNTER — Encounter: Payer: Self-pay | Admitting: Internal Medicine

## 2022-12-31 NOTE — Assessment & Plan Note (Signed)
Age and sex appropriate education and counseling updated with regular exercise and diet Referrals for preventative services - for f/u DXA later this month with GYN Immunizations addressed - declines covid booster, flu shot Smoking counseling  - none needed Evidence for depression or other mood disorder - none significant Most recent labs reviewed. I have personally reviewed and have noted: 1) the patient's medical and social history 2) The patient's current medications and supplements 3) The patient's height, weight, and BMI have been recorded in the chart

## 2023-01-11 DIAGNOSIS — M8588 Other specified disorders of bone density and structure, other site: Secondary | ICD-10-CM | POA: Diagnosis not present

## 2023-01-11 DIAGNOSIS — M85852 Other specified disorders of bone density and structure, left thigh: Secondary | ICD-10-CM | POA: Diagnosis not present

## 2023-01-11 DIAGNOSIS — M8589 Other specified disorders of bone density and structure, multiple sites: Secondary | ICD-10-CM | POA: Diagnosis not present

## 2023-01-28 ENCOUNTER — Emergency Department (HOSPITAL_COMMUNITY): Payer: BC Managed Care – PPO

## 2023-01-28 ENCOUNTER — Other Ambulatory Visit: Payer: Self-pay

## 2023-01-28 ENCOUNTER — Encounter (HOSPITAL_COMMUNITY): Payer: Self-pay | Admitting: Emergency Medicine

## 2023-01-28 ENCOUNTER — Observation Stay (HOSPITAL_COMMUNITY)
Admission: EM | Admit: 2023-01-28 | Discharge: 2023-01-30 | Disposition: A | Discharge status: Home / Self Care | Payer: BC Managed Care – PPO | Attending: Internal Medicine | Admitting: Internal Medicine

## 2023-01-28 DIAGNOSIS — R42 Dizziness and giddiness: Secondary | ICD-10-CM

## 2023-01-28 DIAGNOSIS — R7989 Other specified abnormal findings of blood chemistry: Secondary | ICD-10-CM | POA: Diagnosis not present

## 2023-01-28 DIAGNOSIS — R002 Palpitations: Secondary | ICD-10-CM

## 2023-01-28 DIAGNOSIS — E785 Hyperlipidemia, unspecified: Secondary | ICD-10-CM | POA: Diagnosis present

## 2023-01-28 DIAGNOSIS — I10 Essential (primary) hypertension: Secondary | ICD-10-CM

## 2023-01-28 DIAGNOSIS — R030 Elevated blood-pressure reading, without diagnosis of hypertension: Secondary | ICD-10-CM | POA: Diagnosis not present

## 2023-01-28 DIAGNOSIS — Z79899 Other long term (current) drug therapy: Secondary | ICD-10-CM | POA: Diagnosis not present

## 2023-01-28 DIAGNOSIS — R519 Headache, unspecified: Secondary | ICD-10-CM | POA: Diagnosis not present

## 2023-01-28 DIAGNOSIS — R202 Paresthesia of skin: Secondary | ICD-10-CM | POA: Insufficient documentation

## 2023-01-28 DIAGNOSIS — R531 Weakness: Secondary | ICD-10-CM | POA: Diagnosis not present

## 2023-01-28 DIAGNOSIS — R778 Other specified abnormalities of plasma proteins: Secondary | ICD-10-CM | POA: Diagnosis not present

## 2023-01-28 LAB — TROPONIN I (HIGH SENSITIVITY)
Troponin I (High Sensitivity): 4 ng/L (ref <18)
Troponin I (High Sensitivity): 55 ng/L — ABNORMAL HIGH (ref <18)
Troponin I (High Sensitivity): 82 ng/L — ABNORMAL HIGH (ref <18)

## 2023-01-28 LAB — URINALYSIS, ROUTINE W REFLEX MICROSCOPIC
Bilirubin Urine: NEGATIVE
Glucose, UA: NEGATIVE mg/dL
Hgb urine dipstick: NEGATIVE
Ketones, ur: NEGATIVE mg/dL
Leukocytes,Ua: NEGATIVE
Nitrite: NEGATIVE
Protein, ur: NEGATIVE mg/dL
Specific Gravity, Urine: 1.002 — ABNORMAL LOW (ref 1.005–1.030)
pH: 6 (ref 5.0–8.0)

## 2023-01-28 LAB — BASIC METABOLIC PANEL
Anion gap: 13 (ref 5–15)
BUN: 11 mg/dL (ref 6–20)
CO2: 22 mmol/L (ref 22–32)
Calcium: 9.5 mg/dL (ref 8.9–10.3)
Chloride: 104 mmol/L (ref 98–111)
Creatinine, Ser: 0.98 mg/dL (ref 0.44–1.00)
GFR, Estimated: >60 mL/min (ref >60)
Glucose, Bld: 110 mg/dL — ABNORMAL HIGH (ref 70–99)
Potassium: 3.5 mmol/L (ref 3.5–5.1)
Sodium: 139 mmol/L (ref 135–145)

## 2023-01-28 LAB — CBC
HCT: 42.9 % (ref 36.0–46.0)
Hemoglobin: 14.6 g/dL (ref 12.0–15.0)
MCH: 30.5 pg (ref 26.0–34.0)
MCHC: 34 g/dL (ref 30.0–36.0)
MCV: 89.6 fL (ref 80.0–100.0)
Platelets: 208 10*3/uL (ref 150–400)
RBC: 4.79 MIL/uL (ref 3.87–5.11)
RDW: 11.9 % (ref 11.5–15.5)
WBC: 8.4 10*3/uL (ref 4.0–10.5)
nRBC: 0 % (ref 0.0–0.2)

## 2023-01-28 LAB — TSH: TSH: 1.986 u[IU]/mL (ref 0.350–4.500)

## 2023-01-28 MED ORDER — ENOXAPARIN SODIUM 40 MG/0.4ML IJ SOSY
40.0000 mg | PREFILLED_SYRINGE | INTRAMUSCULAR | Status: DC
  Administered 2023-01-29 – 2023-01-30 (×2): 40 mg via SUBCUTANEOUS
  Filled 2023-01-28 (×2): qty 0.4

## 2023-01-28 MED ORDER — ONDANSETRON HCL 4 MG/2ML IJ SOLN: 4.0000 mg | Freq: Four times a day (QID) | INTRAMUSCULAR | Status: DC | PRN

## 2023-01-28 MED ORDER — ACETAMINOPHEN 325 MG PO TABS
650.0000 mg | ORAL_TABLET | ORAL | Status: DC | PRN
  Administered 2023-01-29: 650 mg via ORAL
  Filled 2023-01-28: qty 2

## 2023-01-28 NOTE — ED Provider Notes (Addendum)
Signed out around 1600 by Dr Charm Barges than ua and tsh pending. If ok may d/c.   Delta trop did come back high. No chest pain or discomfort. Ecg. Will get 3rd trop.  3rd trop also trending mildly up for prior. No current chest pain or discomfort. ?whether earlier episode pta of weakness was possibly anginal symptom. Ecg noted to have mild st depression laterally, no old ecg to compare. (Pt did have cardiac calc score 02/2022 of 0). Not clear if earlier episode related to acs/cad, verses possible tachydysrhythmia, acute/severe htn, severe anxiety/stress rxn, with cardiac strain.   Will consult cardiology, admit to medicine.  Discussed w cardiology on call who reviewed ecg and labs - they recommend admission to medicine, trend trops, possible echo tomorrow.   Hospitalists consulted for admission.         Cathren Laine, MD 01/28/23 2050

## 2023-01-28 NOTE — ED Notes (Signed)
Lab confirms they are running the patient's troponin at this time.

## 2023-01-28 NOTE — ED Triage Notes (Signed)
Patient arrives by POV in wheelchair c/o feeling light headed and "off" today. Patient states yesterday her BP was elevated. No history of hypertension. Patient states she feels shaky and that both of her feet feel weird. Patient A&0x4.

## 2023-01-28 NOTE — ED Provider Notes (Signed)
[place] [facility]   CSN: [id number] Arrival date & time: 01/28/23  1201     History  Chief Complaint  Patient presents with   Hypertension    Caitlyn Schmidt is a 62 y.o. female.  She tells me she went for routine eye exam a few days ago and they noticed some small areas of hemorrhage, checked her blood pressure and found it to be slightly elevated.  She has been keeping a record of her blood pressure and it has been more elevated over the last few days.  She said it was normal this morning but then began feeling very lightheaded and not well.  She checked her blood pressure and found it to be markedly elevated.  Called her PCP who told her to come immediately to the emergency department.  She does not have a real significant headache and not having very blurry vision now but has had it on and off.  She thinks it is related to switching glasses when using a computer.  No chest pain shortness of breath abdominal pain vomiting diarrhea urinary symptoms.  She did notice some aching feeling in her knees and may be some tingling in her feet on the way over here.  She takes a bunch of herbal and vitamin medications but nothing is new  The history is provided by the patient.  Hypertension This is a new problem. The current episode started 2 days ago. The problem occurs daily. The problem has been gradually worsening. Pertinent negatives include no chest pain, no abdominal pain, no headaches and no shortness of breath. Nothing aggravates the symptoms. Nothing relieves the symptoms. She has tried rest for the symptoms. The treatment provided no relief.       Home Medications Prior to Admission medications   Medication Sig Start Date End Date Taking? Authorizing Provider  ASHWAGANDHA PO Take 2,000 mg by mouth daily in the afternoon.    [provider]  Calcium-Vitamin D-Vitamin K (CALCIUM SOFT CHEWS PO) Take by mouth.    [provider]  cholecalciferol (VITAMIN D) 1000 UNITS tablet Take 2,000 Units by mouth daily.    [provider]  Multiple Vitamin (MULTIVITAMIN) tablet Take 1 tablet by mouth daily.    [provider]  Omega-3 Fatty Acids (FISH OIL OMEGA-3 PO) Take 1,400 mg by mouth daily in the afternoon.    [provider]  rosuvastatin (CRESTOR) 20 MG tablet Take 1 tablet (20 mg total) by mouth daily. 12/29/22   Corwin LevinsJohn, James W, MD      Allergies    Prednisone    Review of Systems   Review of Systems  Constitutional:  Positive for fatigue. Negative for fever.  Eyes:  Positive for visual disturbance.  Respiratory:  Negative for shortness of breath.   Cardiovascular:  Negative for chest pain.  Gastrointestinal:  Negative for abdominal pain.  Neurological:  Positive for numbness. Negative for headaches.    Physical Exam Updated Vital Signs BP (!) 185/95 (BP Location: Right Arm)   Pulse 86   Temp 98 F (36.7 C)   Resp 18   Ht 5' 9.5" (1.765 m)   Wt 69.9 kg   SpO2 100%   BMI 22.42 kg/m  Physical Exam Vitals and nursing note reviewed.  Constitutional:      General: She is not in acute distress.    Appearance: Normal appearance. She is well-developed.  HENT:     Head: Normocephalic and  atraumatic.  Eyes:     Conjunctiva/sclera: Conjunctivae normal.  Cardiovascular:     Rate and Rhythm: Normal rate and regular rhythm.     Heart sounds: No murmur heard. Pulmonary:     Effort: Pulmonary effort is normal. No respiratory distress.     Breath sounds: Normal breath sounds.  Abdominal:     Palpations: Abdomen is soft.     Tenderness: There is no abdominal tenderness. There is no guarding or rebound.  Musculoskeletal:        General: No swelling.     Cervical back: Neck supple.  Skin:    General: Skin is warm and dry.     Capillary Refill: Capillary refill takes less than 2 seconds.  Neurological:     General: No focal deficit present.     Mental Status: She is  alert and oriented to person, place, and time.     Cranial Nerves: No cranial nerve deficit.     Sensory: No sensory deficit.     Motor: No weakness.     Gait: Gait normal.     ED Results / Procedures / Treatments   Labs (all labs ordered are listed, but only abnormal results are displayed) Labs Reviewed  BASIC METABOLIC PANEL - Abnormal; Notable for the following components:      Result Value   Glucose, Bld 110 (*)    All other components within normal limits  URINALYSIS, ROUTINE W REFLEX MICROSCOPIC - Abnormal; Notable for the following components:   Color, Urine COLORLESS (*)    Specific Gravity, Urine 1.002 (*)    All other components within normal limits  TROPONIN I (HIGH SENSITIVITY) - Abnormal; Notable for the following components:   Troponin I (High Sensitivity) 55 (*)    All other components within normal limits  CBC  TSH  TROPONIN I (HIGH SENSITIVITY)  TROPONIN I (HIGH SENSITIVITY)    EKG EKG Interpretation  Date/Time:  Friday January 28 2023 12:01:52 EDT Ventricular Rate:  84 PR Interval:  176 QRS Duration: 96 QT Interval:  400 QTC Calculation: 472 R Axis:   53 Text Interpretation: Normal sinus rhythm Right atrial enlargement Septal infarct , age undetermined Abnormal ECG No previous ECGs available Confirmed by Meridee Score [phone]) on 01/28/2023 2:13:12 PM  Radiology CT Head Wo Contrast  Result Date: 01/28/2023 CLINICAL DATA:  Paresthesias, right knee pain with hypertension EXAM: CT HEAD WITHOUT CONTRAST TECHNIQUE: Contiguous axial images were obtained from the base of the skull through the vertex without intravenous contrast. RADIATION DOSE REDUCTION: This exam was performed according to the departmental dose-optimization program which includes automated exposure control, adjustment of the mA and/or kV according to patient size and/or use of iterative reconstruction technique. COMPARISON:  None Available. FINDINGS: Brain: No evidence of acute infarction,  hemorrhage, hydrocephalus, extra-axial collection or mass lesion/mass effect. Vascular: No hyperdense vessel or unexpected calcification. Skull: Normal. Negative for fracture or focal lesion. Sinuses/Orbits: No acute finding. Other: None. IMPRESSION: No acute intracranial pathology. Electronically Signed   By: Larose Hires D.O.   On: 01/28/2023 14:23   DG Chest 1 View  Result Date: 01/28/2023 CLINICAL DATA:  Dizziness with hypertension. EXAM: CHEST  1 VIEW COMPARISON:  Chest x-ray dated February 20, 2018. FINDINGS: The heart size and mediastinal contours are within normal limits. Both lungs are clear. The visualized skeletal structures are unremarkable. IMPRESSION: No active disease. Electronically Signed   By: Obie Dredge M.D.   On: 01/28/2023 12:36    Procedures Procedures  Medications Ordered in ED Medications - No data to display  ED Course/ Medical Decision Making/ A&P Clinical Course as of 01/28/23 1703  Fri Jan 28, 2023  1440 Repeat blood pressure 143/77.  [MB]    Clinical Course User Index [MB] Terrilee FilesButler, Elizette Shek C, MD                             Medical Decision Making Amount and/or Complexity of Data Reviewed Labs: ordered. ECG/medicine tests: ordered.   This patient complains of elevated blood pressure tingling in feet malaise lightheadedness ; this involves an extensive number of treatment Options and is a complaint that carries with it a high risk of complications and morbidity. The differential includes hypertension, ACS, stroke, bleed, metabolic derangement, anxiety  I ordered, reviewed and interpreted labs, which included CBC normal chemistries normal other than mildly elevated glucose, initial troponin unremarkable, UA and delta troponin TSH pending I ordered imaging studies which included chest x-ray and head CT and I independently    visualized and interpreted imaging which showed no acute findings Additional history obtained from patient's husband Previous  records obtained and reviewed in epic no recent notes Cardiac monitoring reviewed, normal sinus rhythm Social determinants considered, no significant barriers Critical Interventions: None  After the interventions stated above, I reevaluated the patient and found patient to be not back to baseline although feeling improved.  Blood pressure is also coming down. Admission and further testing considered, her care is signed out to Dr. Denton LankSteinl to follow-up on results of TSH delta troponin urinalysis.  If these are unchanged she likely can follow-up with her primary care doctor for further evaluation.         Final Clinical Impression(s) / ED Diagnoses Final diagnoses:  Hypertension, unspecified type  Paresthesia of both feet  Lightheadedness    Rx / DC Orders ED Discharge Orders     None         Terrilee FilesButler, Jasper Ruminski C, MD 01/28/23 [phone]

## 2023-01-28 NOTE — Treatment Plan (Signed)
Cardiology Consultation   Patient ID: Caitlyn Schmidt MRN: [id number]; DOB: 1961-02-26  Admit date: 01/28/2023 Date of Consult: 01/28/2023  PCP:  Caitlyn Schmidt, Caitlyn Schmidt   [facility] Providers Cardiologist:  None   { Click here to update Schmidt or APP on Care Team, Refresh:1}     Patient Profile:   Caitlyn Schmidt is a 62 y.o. female with a hx of *** who is being seen 01/28/2023 for the evaluation of *** at the request of ***.  History of Present Illness:   Caitlyn Schmidt ***   Past Medical History:  Diagnosis Date   ADD (attention deficit disorder)    Allergic rhinitis, mild    Essential hypertension 06/04/2015   Hyperlipidemia    IBS (irritable bowel syndrome)    Melanoma 1995    Past Surgical History:  Procedure Laterality Date   COLPOSCOPY     WRIST SURGERY  2010     {Home Medications (Optional):21181}  Inpatient Medications: Scheduled Meds:  Continuous Infusions:  PRN Meds:   Allergies:    Allergies  Allergen Reactions   Prednisone     REACTION: Jittery    Social History:   Social History   Socioeconomic History   Marital status: Divorced    Spouse name: Not on file   Number of children: Not on file   Years of education: Not on file   Highest education level: Not on file  Occupational History   Not on file  Tobacco Use   Smoking status: Never   Smokeless tobacco: Never  Substance and Sexual Activity   Alcohol use: Yes    Alcohol/week: 0.0 standard drinks of alcohol   Drug use: No   Sexual activity: Not Currently    Birth control/protection: Post-menopausal  Other Topics Concern   Not on file  Social History Narrative   Not on file   Social Determinants of Health   Financial Resource Strain: Not on file  Food Insecurity: Not on file  Transportation Needs: Not on file  Physical Activity: Not on file  Stress: Not on file  Social Connections: Not on file  Intimate Partner Violence: Not on file    Family History:   *** Family  History  Problem Relation Age of Onset   Hypertension Mother    Thyroid disease Mother    Cancer Mother        skin   Hypertension Father    Hyperlipidemia Father    Cancer Maternal Grandmother        multiple skin cancer   Diabetes Maternal Grandmother    Hypertension Maternal Grandmother    Osteoporosis Maternal Grandmother    Macular degeneration Maternal Grandmother    Gout Maternal Grandfather    Heart disease Maternal Grandfather    Diabetes Maternal Grandfather    Kidney disease Maternal Grandfather    Heart disease Paternal Grandmother    Heart disease Paternal Grandfather    Cancer Paternal Grandfather        prostate     ROS:  Please see the history of present illness.  *** All other ROS reviewed and negative.     Physical Exam/Data:   Vitals:   01/28/23 1208 01/28/23 1441 01/28/23 1520 01/28/23 1900  BP: (!) 185/95 (!) 143/77 134/76 125/69  Pulse: 86   85  Resp: 18   18  Temp: 98 F (36.7 C)   98.2 F (36.8 C)  TempSrc:    Oral  SpO2: 100%   100%  Weight:      Height:       No intake or output data in the 24 hours ending 01/28/23 2121    01/28/2023   12:08 PM 12/29/2022    9:04 AM 12/24/2021    8:59 AM  Last 3 Weights  Weight (lbs) 154 lb 150 lb 149 lb  Weight (kg) 69.854 kg 68.04 kg 67.586 kg     Body mass index is 22.42 kg/m.  General:  Well nourished, well developed, in no acute distress*** HEENT: normal Neck: no JVD Vascular: No carotid bruits; Distal pulses 2+ bilaterally Cardiac:  normal S1, S2; RRR; no murmur *** Lungs:  clear to auscultation bilaterally, no wheezing, rhonchi or rales  Abd: soft, nontender, no hepatomegaly  Ext: no edema Musculoskeletal:  No deformities, BUE and BLE strength normal and equal Skin: warm and dry  Neuro:  CNs 2-12 intact, no focal abnormalities noted Psych:  Normal affect   EKG:  The EKG was personally reviewed and demonstrates:  *** Telemetry:  Telemetry was personally reviewed and demonstrates:   ***  Relevant CV Studies: ***  Laboratory Data:  High Sensitivity Troponin:   Recent Labs  Lab 01/28/23 1221 01/28/23 1515 01/28/23 1740  TROPONINIHS 4 55* 82*     Chemistry Recent Labs  Lab 01/28/23 1221  NA 139  K 3.5  CL 104  CO2 22  GLUCOSE 110*  BUN 11  CREATININE 0.98  CALCIUM 9.5  GFRNONAA >60  ANIONGAP 13    No results for input(s): "PROT", "ALBUMIN", "AST", "ALT", "ALKPHOS", "BILITOT" in the last 168 hours. Lipids No results for input(s): "CHOL", "TRIG", "HDL", "LABVLDL", "LDLCALC", "CHOLHDL" in the last 168 hours.  Hematology Recent Labs  Lab 01/28/23 1221  WBC 8.4  RBC 4.79  HGB 14.6  HCT 42.9  MCV 89.6  MCH 30.5  MCHC 34.0  RDW 11.9  PLT 208   Thyroid  Recent Labs  Lab 01/28/23 1515  TSH 1.986    BNPNo results for input(s): "BNP", "PROBNP" in the last 168 hours.  DDimer No results for input(s): "DDIMER" in the last 168 hours.   Radiology/Studies:  CT Head Wo Contrast  Result Date: 01/28/2023 CLINICAL DATA:  Paresthesias, right knee pain with hypertension EXAM: CT HEAD WITHOUT CONTRAST TECHNIQUE: Contiguous axial images were obtained from the base of the skull through the vertex without intravenous contrast. RADIATION DOSE REDUCTION: This exam was performed according to the departmental dose-optimization program which includes automated exposure control, adjustment of the mA and/or kV according to patient size and/or use of iterative reconstruction technique. COMPARISON:  None Available. FINDINGS: Brain: No evidence of acute infarction, hemorrhage, hydrocephalus, extra-axial collection or mass lesion/mass effect. Vascular: No hyperdense vessel or unexpected calcification. Skull: Normal. Negative for fracture or focal lesion. Sinuses/Orbits: No acute finding. Other: None. IMPRESSION: No acute intracranial pathology. Electronically Signed   By: Larose Hires D.O.   On: 01/28/2023 14:23   DG Chest 1 View  Result Date: 01/28/2023 CLINICAL DATA:   Dizziness with hypertension. EXAM: CHEST  1 VIEW COMPARISON:  Chest x-ray dated February 20, 2018. FINDINGS: The heart size and mediastinal contours are within normal limits. Both lungs are clear. The visualized skeletal structures are unremarkable. IMPRESSION: No active disease. Electronically Signed   By: Obie Dredge M.D.   On: 01/28/2023 12:36     Assessment and Plan:   ***   Risk Assessment/Risk Scores:  {Complete the following score calculators/questions to meet required metrics.  Press F2         :  [id number]}   {Is the patient being seen for unstable angina, ACS, NSTEMI or STEMI?:[phone]} {Does this patient have CHF or CHF symptoms?      :[id number]} {Does this patient have ATRIAL FIBRILLATION?:[phone]}  {Are we signing off today?:[id number]}  For questions or updates, please contact [facility] Please consult [email or web address] for contact info under    Signed, Rachael Feerystal [place], Schmidt  01/28/2023 9:21 PM

## 2023-01-28 NOTE — H&P (Signed)
History and Physical    Patient: Caitlyn Schmidt Weingart AVW:[id number] DOB: Jun 26, 1961 DOA: 01/28/2023 DOS: the patient was seen and examined on 01/29/2023 PCP: Corwin LevinsJohn, James W, MD  Patient coming from: Home  Chief Complaint:  Chief Complaint  Patient presents with   Hypertension   Weakness   HPI: Caitlyn Schmidt Goulette is a 62 y.o. female with medical history significant of HLD, B12 deficiency who presents with concerns of elevated BP and feet numbness.  Patient saw ophthalmology 2 days ago and was noted to have higher blood pressure than usual with SBP of 140s.  She was then told to monitor it at home.  Today while working at home she suddenly felt unwell.  Had headache and felt both her feet going numb with tingling. Felt heart fluttering. Felt she was going to pass out. Checked her blood pressure and SBP was up to 170s which prompted her to present to the ED.  States she has never had issues with blood pressure and recently had her routine checkup without any issues.  She does report some increased stress lately but did not speak on it further. While waiting in triage she perhaps some chest tightness but not sure if it was just her anxiety. No nausea, vomiting or diarrhea.   Has some right patella tendon pain and also brought up issues with her TMJ.   Father with hx of a.fib, alcoholism and HTN.   In the ED, she was afebrile, initial blood pressure reading up to SBP of 180 but later normalized without intervention.  On room air.  CBC unremarkable without any leukocytosis or anemia.  No significant electrolyte normalities on BMP other than mildly elevated glucose of 110.  Troponin was initially negative for but upward trended to 55 and then 82.  TSH was within normal limits.   CT head was negative.   Initial EKG on my review with inferior lateral ST depression.  This appears to be improved in follow-up EKG.  ED physician consulted cardiology due to upward trending troponin and was advised to  continue to follow serial troponin and to obtain echocardiogram.  Hospitalist then consulted for admission. Review of Systems: As mentioned in the history of present illness. All other systems reviewed and are negative. Past Medical History:  Diagnosis Date   ADD (attention deficit disorder)    Allergic rhinitis, mild    Essential hypertension 06/04/2015   Hyperlipidemia    IBS (irritable bowel syndrome)    Melanoma 1995   Past Surgical History:  Procedure Laterality Date   COLPOSCOPY     WRIST SURGERY  2010   Social History:  reports that she has never smoked. She has never used smokeless tobacco. She reports current alcohol use. She reports that she does not use drugs.  Allergies  Allergen Reactions   Prednisone     REACTION: Jittery    Family History  Problem Relation Age of Onset   Hypertension Mother    Thyroid disease Mother    Cancer Mother        skin   Hypertension Father    Hyperlipidemia Father    Cancer Maternal Grandmother        multiple skin cancer   Diabetes Maternal Grandmother    Hypertension Maternal Grandmother    Osteoporosis Maternal Grandmother    Macular degeneration Maternal Grandmother    Gout Maternal Grandfather    Heart disease Maternal Grandfather    Diabetes Maternal Grandfather    Kidney disease Maternal Grandfather  Heart disease Paternal Grandmother    Heart disease Paternal Grandfather    Cancer Paternal Grandfather        prostate    Prior to Admission medications   Medication Sig Start Date End Date Taking? Authorizing Provider  ASHWAGANDHA PO Take 2,000 mg by mouth daily in the afternoon.    [provider]  Calcium-Vitamin D-Vitamin K (CALCIUM SOFT CHEWS PO) Take by mouth.    [provider]  cholecalciferol (VITAMIN D) 1000 UNITS tablet Take 2,000 Units by mouth daily.    [provider]  Multiple Vitamin (MULTIVITAMIN) tablet Take 1 tablet by mouth daily.    [provider]  Omega-3  Fatty Acids (FISH OIL OMEGA-3 PO) Take 1,400 mg by mouth daily in the afternoon.    [provider]  rosuvastatin (CRESTOR) 20 MG tablet Take 1 tablet (20 mg total) by mouth daily. 12/29/22   Corwin Levins, MD    Physical Exam: Vitals:   01/28/23 1441 01/28/23 1520 01/28/23 1900 01/28/23 2323  BP: (!) 143/77 134/76 125/69 124/62  Pulse:   85 71  Resp:   18 13  Temp:   98.2 F (36.8 C) 98.5 F (36.9 C)  TempSrc:   Oral   SpO2:   100% 100%  Weight:      Height:       Constitutional: NAD, calm, comfortable, elderly female appearing younger than stated age laying at approximately 40 degree incline in bed Eyes: lids and conjunctivae normal ENMT: Mucous membranes are moist.  Neck: normal, supple Respiratory: clear to auscultation bilaterally, no wheezing, no crackles. Normal respiratory effort. No accessory muscle use.  Cardiovascular: Regular rate and rhythm, no murmurs / rubs / gallops. No extremity edema. Abdomen: no tenderness, Bowel sounds positive.  Musculoskeletal: no clubbing / cyanosis. No joint deformity upper and lower extremities. Good ROM, no contractures. Normal muscle tone.  Skin: no rashes, lesions, ulcers. Neurologic: CN 2-12 grossly intact.  Strength 5/5 in all 4.  Psychiatric: Normal judgment and insight. Alert and oriented x 3. Normal mood. Data Reviewed:  See HPI  Assessment and Plan: * Elevated troponin -pt present with vague non-specific symptoms of headache, extremity paresthesia and heart palpitation with ambulatory reading of SBP of 170 at home. Her BP was elevated on initial presentation to SBP of 180 which resolved shortly after. She is anxious on exam which could be contributory.  -Her initial troponin was negative at 4 but has been trending upwards to 55-->82.  Initial EKG on my review with inferior lateral ST depression.  This appears to be improved in follow-up EKG. Possible could be demand from transient elevation in BP but will need to rule out  ACS -Check UDS and ETOH level -continue to follow serial troponin -obtain echocardiogram -cardiology was consulted by ED physician and will evaluate  Elevated BP without diagnosis of hypertension Transient elevation in BP that have spontaneously resolved -continue to monitor -more likely to be stress related as pt reports increase stress and is anxious on exam  Hyperlipidemia -continue statin      Advance Care Planning: Full  Consults: ED physician consulted cardiology.  Family Communication: none at bedside  Severity of Illness: The appropriate patient status for this patient is OBSERVATION. Observation status is judged to be reasonable and necessary in order to provide the required intensity of service to ensure the patient's safety. The patient's presenting symptoms, physical exam findings, and initial radiographic and laboratory data in the context of their medical condition is  felt to place them at decreased risk for further clinical deterioration. Furthermore, it is anticipated that the patient will be medically stable for discharge from the hospital within 2 midnights of admission.   Author: Anselm Junglinghing T Willis Holquin, DO 01/29/2023 12:24 AM  For on call review [email or web address].

## 2023-01-28 NOTE — ED Provider Triage Note (Signed)
Emergency Medicine Provider Triage Evaluation Note  Caitlyn Schmidt , a 62 y.o. female  was evaluated in triage.  Pt complains of hypertension and right knee paresthesia.  Patient denied having history of high blood pressure and being on any blood pressure medications however this morning she took her blood pressure 3 times because she felt off and it was 177/90s.  Patient called her primary care provider who referred her to the ED to be further evaluated as she continues to feel off and having right knee paresthesias.  Patient states she is able to walk but that she has numbness in her right knee.  Patient denied chest pain, shortness of breath, abdominal pain, nausea/vomiting, new onset weakness, dysuria, back pain, recent trauma, fever/chills, vision changes, headache  Review of Systems  Positive: See HPI Negative: See HPI  Physical Exam  BP (!) 185/95 (BP Location: Right Arm)   Pulse 86   Temp 98 F (36.7 C)   Resp 18   Ht 5' 9.5" (1.765 m)   Wt 69.9 kg   SpO2 100%   BMI 22.42 kg/m  Gen:   Awake, no distress   Resp:  Normal effort  MSK:   Moves extremities without difficulty  Other:  Decreased sensation in right knee, sensation in distal extremities intact, full active range of motion in all 4 limbs including knees, 2+ bilateral radial/posterior tibialis pulses with regular rate; no abdominal tenderness, lungs clear to auscultation bilaterally, bilateral pupils PERRL  Medical Decision Making  Medically screening exam initiated at 12:20 PM.  Appropriate orders placed.  Caitlyn Schmidt was informed that the remainder of the evaluation will be completed by another provider, this initial triage assessment does not replace that evaluation, and the importance of remaining in the ED until their evaluation is complete.  Workup initiated, labs and imaging ordered, nurse states she did a neuroexam that was unremarkable my physical exam was also unremarkable, patient stable at this time    Remi Deter 01/28/23 1224

## 2023-01-28 NOTE — Discharge Instructions (Signed)
You were seen in the emergency department for dizziness lightheadedness tingling in your feet and elevated blood pressures.  Your blood pressure came down somewhat while you were here and you had lab work EKG and a CAT scan of your head and x-ray of your chest that did not show any significant abnormalities.  Please continue to monitor your blood pressure at home and schedule a follow-up with your primary care doctor.

## 2023-01-28 NOTE — ED Notes (Signed)
While in triage, patient began c/o right leg heaviness. Patient has equal grip strength and equal sensation to all limbs. A&0x4. No drift to arms or legs noted.

## 2023-01-29 ENCOUNTER — Observation Stay (HOSPITAL_BASED_OUTPATIENT_CLINIC_OR_DEPARTMENT_OTHER): Payer: BC Managed Care – PPO

## 2023-01-29 DIAGNOSIS — I10 Essential (primary) hypertension: Secondary | ICD-10-CM | POA: Diagnosis not present

## 2023-01-29 DIAGNOSIS — Z79899 Other long term (current) drug therapy: Secondary | ICD-10-CM | POA: Diagnosis not present

## 2023-01-29 DIAGNOSIS — E785 Hyperlipidemia, unspecified: Secondary | ICD-10-CM | POA: Diagnosis not present

## 2023-01-29 DIAGNOSIS — R030 Elevated blood-pressure reading, without diagnosis of hypertension: Secondary | ICD-10-CM | POA: Diagnosis not present

## 2023-01-29 DIAGNOSIS — R7989 Other specified abnormal findings of blood chemistry: Secondary | ICD-10-CM | POA: Diagnosis not present

## 2023-01-29 LAB — RAPID URINE DRUG SCREEN, HOSP PERFORMED
Amphetamines: NOT DETECTED
Barbiturates: NOT DETECTED
Benzodiazepines: NOT DETECTED
Cocaine: NOT DETECTED
Opiates: NOT DETECTED
Tetrahydrocannabinol: NOT DETECTED

## 2023-01-29 LAB — ECHOCARDIOGRAM COMPLETE
AR max vel: 2.19 cm2
AV Area VTI: 1.9 cm2
AV Area mean vel: 1.94 cm2
AV Mean grad: 3 mmHg
AV Peak grad: 6.3 mmHg
Ao pk vel: 1.25 m/s
Area-P 1/2: 4.49 cm2
Height: 69.5 in
S' Lateral: 3.2 cm
Weight: 2360 oz

## 2023-01-29 LAB — TROPONIN I (HIGH SENSITIVITY)
Troponin I (High Sensitivity): 61 ng/L — ABNORMAL HIGH (ref <18)
Troponin I (High Sensitivity): 45 ng/L — ABNORMAL HIGH (ref <18)

## 2023-01-29 LAB — HIV ANTIBODY (ROUTINE TESTING W REFLEX): HIV Screen 4th Generation wRfx: NONREACTIVE

## 2023-01-29 LAB — ETHANOL: Alcohol, Ethyl (B): <10 mg/dL (ref <10)

## 2023-01-29 MED ORDER — ROSUVASTATIN CALCIUM 20 MG PO TABS
20.0000 mg | ORAL_TABLET | Freq: Every day | ORAL | Status: DC
  Administered 2023-01-29 – 2023-01-30 (×2): 20 mg via ORAL
  Filled 2023-01-29 (×2): qty 1

## 2023-01-29 MED ORDER — ORAL CARE MOUTH RINSE: 15.0000 mL | OROMUCOSAL | Status: DC | PRN

## 2023-01-29 MED ORDER — OMEGA-3-ACID ETHYL ESTERS 1 G PO CAPS
1000.0000 mg | ORAL_CAPSULE | Freq: Every day | ORAL | Status: DC
  Administered 2023-01-29 – 2023-01-30 (×2): 1000 mg via ORAL
  Filled 2023-01-29 (×2): qty 1

## 2023-01-29 NOTE — ED Notes (Signed)
ED TO INPATIENT HANDOFF REPORT  ED Nurse Name and Phone #: Carollee HerterShannon, RN [phone]  S Name/Age/Gender Caitlyn Schmidt 62 y.o. female Room/Bed: 015C/015C  Code Status   Code Status: Full Code  Home/SNF/Other Home Patient oriented to: self, place, time, and situation Is this baseline? Yes   Triage Complete: Triage complete  Chief Complaint Elevated troponin [R79.89]  Triage Note Patient arrives by POV in wheelchair c/o feeling light headed and "off" today. Patient states yesterday her BP was elevated. No history of hypertension. Patient states she feels shaky and that both of her feet feel weird. Patient A&0x4.    Allergies Allergies  Allergen Reactions   Prednisone     REACTION: Jittery    Level of Care/Admitting Diagnosis ED Disposition     ED Disposition  Admit   Condition  --   Comment  [facility] [[id number]]  Level of Care: Telemetry Cardiac [103]  May place patient in observation at [facility] or [facility] if equivalent level of care is available:: No  Covid Evaluation: Asymptomatic - no recent exposure (last 10 days) testing not required  Diagnosis: Elevated troponin [321909]  Admitting Physician: Anselm JunglingU, CHING T [[id number]]  Attending Physician: Anselm JunglingU, CHING T [[id number]]          B Medical/Surgery History Past Medical History:  Diagnosis Date   ADD (attention deficit disorder)    Allergic rhinitis, mild    Essential hypertension 06/04/2015   Hyperlipidemia    IBS (irritable bowel syndrome)    Melanoma 1995   Past Surgical History:  Procedure Laterality Date   COLPOSCOPY     WRIST SURGERY  2010     A IV Location/Drains/Wounds Patient Lines/Drains/Airways Status     Active Line/Drains/Airways     None            Intake/Output Last 24 hours No intake or output data in the 24 hours ending 01/29/23 0010  Labs/Imaging Results for orders placed or performed during the hospital encounter of 01/28/23 (from the past 48  hour(s))  Basic metabolic panel     Status: Abnormal   Collection Time: 01/28/23 12:21 PM  Result Value Ref Range   Sodium 139 135 - 145 mmol/L   Potassium 3.5 3.5 - 5.1 mmol/L   Chloride 104 98 - 111 mmol/L   CO2 22 22 - 32 mmol/L   Glucose, Bld 110 (H) 70 - 99 mg/dL    Comment: Glucose reference range applies only to samples taken after fasting for at least 8 hours.   BUN 11 6 - 20 mg/dL   Creatinine, Ser 7.820.98 0.44 - 1.00 mg/dL   Calcium 9.5 8.9 - 95.610.3 mg/dL   GFR, Estimated >21>60 >30>60 mL/min    Comment: (NOTE) Calculated using the CKD-EPI Creatinine Equation (2021)    Anion gap 13 5 - 15    Comment: Performed at [facility], [address] [place], [place] [address]  CBC     Status: None   Collection Time: 01/28/23 12:21 PM  Result Value Ref Range   WBC 8.4 4.0 - 10.5 K/uL   RBC 4.79 3.87 - 5.11 MIL/uL   Hemoglobin 14.6 12.0 - 15.0 g/dL   HCT 46.942.9 62.936.0 - 52.846.0 %   MCV 89.6 80.0 - 100.0 fL   MCH 30.5 26.0 - 34.0 pg   MCHC 34.0 30.0 - 36.0 g/dL   RDW 41.311.9 24.411.5 - 01.015.5 %   Platelets 208 150 - 400 K/uL  nRBC 0.0 0.0 - 0.2 %    Comment: Performed at [facility], [address] [place], [place] [address]  Troponin I (High Sensitivity)     Status: None   Collection Time: 01/28/23 12:21 PM  Result Value Ref Range   Troponin I (High Sensitivity) 4 <18 ng/L    Comment: (NOTE) Elevated high sensitivity troponin I (hsTnI) values and significant  changes across serial measurements may suggest ACS but many other  chronic and acute conditions are known to elevate hsTnI results.  Refer to the "Links" section for chest pain algorithms and additional  guidance. Performed at [facility], [address] [place], [place] [address]   Troponin I (High Sensitivity)     Status: Abnormal   Collection Time: 01/28/23  3:15 PM  Result Value Ref Range   Troponin I (High Sensitivity) 55 (H) <18 ng/L    Comment: READ BACK AND VERIFIED WITH P Malika Demario RN AT [phone] BY D  LONG (NOTE) Elevated high sensitivity troponin I (hsTnI) values and significant  changes across serial measurements may suggest ACS but many other  chronic and acute conditions are known to elevate hsTnI results.  Refer to the "Links" section for chest pain algorithms and additional  guidance. Performed at [facility], [address] [place], [place] [address]   TSH     Status: None   Collection Time: 01/28/23  3:15 PM  Result Value Ref Range   TSH 1.986 0.350 - 4.500 uIU/mL    Comment: Performed by a 3rd Generation assay with a functional sensitivity of <=0.01 uIU/mL. Performed at [facility], [address], [place], [place] [address]   Urinalysis, Routine w reflex microscopic -Urine, Clean Catch     Status: Abnormal   Collection Time: 01/28/23  4:08 PM  Result Value Ref Range   Color, Urine COLORLESS (A) YELLOW   APPearance CLEAR CLEAR   Specific Gravity, Urine 1.002 (L) 1.005 - 1.030   pH 6.0 5.0 - 8.0   Glucose, UA NEGATIVE NEGATIVE mg/dL   Hgb urine dipstick NEGATIVE NEGATIVE   Bilirubin Urine NEGATIVE NEGATIVE   Ketones, ur NEGATIVE NEGATIVE mg/dL   Protein, ur NEGATIVE NEGATIVE mg/dL   Nitrite NEGATIVE NEGATIVE   Leukocytes,Ua NEGATIVE NEGATIVE    Comment: Performed at [facility], [address], [place], [place] [address]  Troponin I (High Sensitivity)     Status: Abnormal   Collection Time: 01/28/23  5:40 PM  Result Value Ref Range   Troponin I (High Sensitivity) 82 (H) <18 ng/L    Comment: RESULT CALLED TO, READ BACK BY AND VERIFIED WITH P Takeisha Cianci RN 01/28/2023 2017 BNUNNERY (NOTE) Elevated high sensitivity troponin I (hsTnI) values and significant  changes across serial measurements may suggest ACS but many other  chronic and acute conditions are known to elevate hsTnI results.  Refer to the "Links" section for chest pain algorithms and additional  guidance. Performed at [facility], [address], [place], [place] [address]     CT Head Wo Contrast  Result Date: 01/28/2023 CLINICAL DATA:  Paresthesias, right knee pain with hypertension EXAM: CT HEAD WITHOUT CONTRAST TECHNIQUE: Contiguous axial images were obtained from the base of the skull through the vertex without intravenous contrast. RADIATION DOSE REDUCTION: This exam was performed according to the departmental dose-optimization program which includes automated exposure control, adjustment of the mA and/or kV according to patient size and/or use of iterative reconstruction technique.  COMPARISON:  None Available. FINDINGS: Brain: No evidence of acute infarction, hemorrhage, hydrocephalus, extra-axial collection or mass lesion/mass effect. Vascular: No hyperdense vessel or unexpected calcification. Skull: Normal. Negative for fracture or focal lesion. Sinuses/Orbits: No acute finding. Other: None. IMPRESSION: No acute intracranial pathology. Electronically Signed   By: Larose Hires D.O.   On: 01/28/2023 14:23   DG Chest 1 View  Result Date: 01/28/2023 CLINICAL DATA:  Dizziness with hypertension. EXAM: CHEST  1 VIEW COMPARISON:  Chest x-ray dated February 20, 2018. FINDINGS: The heart size and mediastinal contours are within normal limits. Both lungs are clear. The visualized skeletal structures are unremarkable. IMPRESSION: No active disease. Electronically Signed   By: Obie Dredge M.D.   On: 01/28/2023 12:36    Pending Labs Unresulted Labs (From admission, onward)     Start     Ordered   01/29/23 0009  Rapid urine drug screen (hospital performed)  ONCE - STAT,   STAT        01/29/23 0008   01/29/23 0009  Ethanol  Once,   R        01/29/23 0008   01/28/23 2329  HIV Antibody (routine testing w rflx)  (HIV Antibody (Routine testing w reflex) panel)  Once,   R        01/28/23 2329            Vitals/Pain Today's Vitals   01/28/23 1520 01/28/23 1657 01/28/23 1900 01/28/23 2323  BP: 134/76  125/69 124/62  Pulse:   85 71  Resp:   18 13  Temp:   98.2 F (36.8  C) 98.5 F (36.9 C)  TempSrc:   Oral   SpO2:   100% 100%  Weight:      Height:      PainSc:  0-No pain      Isolation Precautions No active isolations  Medications Medications  acetaminophen (TYLENOL) tablet 650 mg (has no administration in time range)  ondansetron (ZOFRAN) injection 4 mg (has no administration in time range)  enoxaparin (LOVENOX) injection 40 mg (has no administration in time range)    Mobility walks     Focused Assessments    R Recommendations: See Admitting Provider Note  Report given to:   Additional Notes: Patient is A&Ox4, no CP or HA. VSS at this time, no complaints.

## 2023-01-29 NOTE — Assessment & Plan Note (Signed)
continue statin

## 2023-01-29 NOTE — Assessment & Plan Note (Addendum)
-  pt present with vague non-specific symptoms of headache, extremity paresthesia and heart palpitation with ambulatory reading of SBP of 170 at home. Her BP was elevated on initial presentation to SBP of 180 which resolved shortly after. She is anxious on exam which could be contributory.  -Her initial troponin was negative at 4 but has been trending upwards to 55-->82.  Initial EKG on my review with inferior lateral ST depression.  This appears to be improved in follow-up EKG. Possible could be demand from transient elevation in BP but will need to rule out ACS -Check UDS and ETOH level -continue to follow serial troponin -obtain echocardiogram -cardiology was consulted by ED physician and will evaluate

## 2023-01-29 NOTE — Progress Notes (Signed)
  Echocardiogram 2D Echocardiogram has been performed.  Delcie Roch 01/29/2023, 2:36 PM

## 2023-01-29 NOTE — Assessment & Plan Note (Signed)
Transient elevation in BP that have spontaneously resolved -continue to monitor -more likely to be stress related as pt reports increase stress and is anxious on exam

## 2023-01-29 NOTE — Progress Notes (Signed)
[facility]                                                                               Caitlyn Schmidt, is a 62 y.o. female, DOB - [birth year], ZOX:[id number] Admit date - 01/28/2023    Outpatient Primary MD for the patient is Corwin Levins, MD  LOS - 0  days    Brief summary    Caitlyn Schmidt is a 62 y.o. female with medical history significant of HLD, B12 deficiency who presents with concerns of elevated BP and paraesthesias of the feet.     Assessment & Plan    Assessment and Plan: * Elevated troponin -pt present with vague non-specific symptoms of headache, extremity paresthesia and heart palpitation with ambulatory reading of SBP of 170 at home. Her BP was elevated on initial presentation to SBP of 180, which resolved.  -Her initial troponin was negative at 4 but has been trending upwards to 55-->82-->45.  - Repeat EKG. TSH wnl. CXR negative.  -Negative UDS and ETOH level - Echocardiogram ordered and unremarkable.  -cardiology was consulted by ED physician, pending.   Elevated BP without diagnosis of hypertension Transient elevation in BP that have spontaneously resolved - suspect stress related.  - she also reported palpitations but telemetry does not show any abnormalities.   Hyperlipidemia -continue statin      Estimated body mass index is 21.47 kg/m as calculated from the following:   Height as of this encounter: 5' 9.5" (1.765 m).   Weight as of this encounter: 66.9 kg.  Code Status: full code.  DVT Prophylaxis:  enoxaparin (LOVENOX) injection 40 mg Start: 01/29/23 1000   Level of Care: Level of care: Telemetry Cardiac Family Communication: family at bedside.   Disposition Plan:     Remains inpatient appropriate:  evaluation of elevated troponins.   Procedures:  Echo  Consultants:   Cardiology.   Antimicrobials:   Anti-infectives (From admission, onward)    None        Medications  Scheduled Meds:  enoxaparin (LOVENOX)  injection  40 mg Subcutaneous Q24H   omega-3 acid ethyl esters  1,000 mg Oral Q1500   rosuvastatin  20 mg Oral Daily   Continuous Infusions: PRN Meds:.acetaminophen, ondansetron (ZOFRAN) IV    Subjective:   Caitlyn Schmidt was seen and examined today.  No chest pain , sob or palpitations.  no new complaints.   Objective:   Vitals:   01/29/23 0526 01/29/23 0730 01/29/23 1120 01/29/23 1545  BP: (!) 129/48 116/61 (!) 114/55 125/66  Pulse: 70 73 67 82  Resp:  18 18 18   Temp: 97.9 F (36.6 C) 97.7 F (36.5 C) 97.7 F (36.5 C) 98.3 F (36.8 C)  TempSrc: Oral Oral Oral Oral  SpO2: 100% 98% 96% 99%  Weight: 66.9 kg     Height:        Intake/Output Summary (Last 24 hours) at 01/29/2023 1821 Last data filed at 01/29/2023 1545 Gross per 24 hour  Intake 75 ml  Output 600 ml  Net -525 ml   Filed Weights   01/28/23 1208 01/29/23 0114 01/29/23 0526  Weight: 69.9 kg 68.3 kg 66.9 kg  Exam General exam: Appears calm and comfortable  Respiratory system: Clear to auscultation. Respiratory effort normal. Cardiovascular system: S1 & S2 heard, RRR. No JVD,  Gastrointestinal system: Abdomen is nondistended, soft and nontender. Central nervous system: Alert and oriented. No focal neurological deficits. Extremities: Symmetric 5 x 5 power. Skin: No rashes, lesions or ulcers Psychiatry: Mood & affect appropriate.    Data Reviewed:  I have personally reviewed following labs and imaging studies   CBC Lab Results  Component Value Date   WBC 8.4 01/28/2023   RBC 4.79 01/28/2023   HGB 14.6 01/28/2023   HCT 42.9 01/28/2023   MCV 89.6 01/28/2023   MCH 30.5 01/28/2023   PLT 208 01/28/2023   MCHC 34.0 01/28/2023   RDW 11.9 01/28/2023   LYMPHSABS 1.6 12/23/2022   MONOABS 0.6 12/23/2022   EOSABS 0.3 12/23/2022   BASOSABS 0.1 12/23/2022     Last metabolic panel Lab Results  Component Value Date   NA 139 01/28/2023   K 3.5 01/28/2023   CL 104 01/28/2023   CO2 22 01/28/2023    BUN 11 01/28/2023   CREATININE 0.98 01/28/2023   GLUCOSE 110 (H) 01/28/2023   GFRNONAA >60 01/28/2023   GFRAA 77 05/24/2007   CALCIUM 9.5 01/28/2023   PROT 6.8 12/23/2022   ALBUMIN 4.2 12/23/2022   BILITOT 0.6 12/23/2022   ALKPHOS 71 12/23/2022   AST 20 12/23/2022   ALT 15 12/23/2022   ANIONGAP 13 01/28/2023    CBG (last 3)  No results for input(s): "GLUCAP" in the last 72 hours.    Coagulation Profile: No results for input(s): "INR", "PROTIME" in the last 168 hours.   Radiology Studies: ECHOCARDIOGRAM COMPLETE  Result Date: 01/29/2023    ECHOCARDIOGRAM REPORT   Patient Name:   Caitlyn Schmidt Date of Exam: 01/29/2023 Medical Rec #:  [id number]       Height:       69.5 in Accession #:    [id number]      Weight:       147.5 lb Date of Birth:  06-06-1961       BSA:          1.824 m Patient Age:    61 years        BP:           114/55 mmHg Patient Gender: F               HR:           63 bpm. Exam Location:  Inpatient Procedure: 2D Echo, Cardiac Doppler, Color Doppler and 3D Echo Indications:    elevated troponin  History:        Patient has prior history of Echocardiogram examinations, most                 recent 03/10/2018. Risk Factors:Dyslipidemia.  Sonographer:    Delcie RochLauren Pennington RDCS Referring Phys: [id number] CHING T TU  Sonographer Comments: Image acquisition challenging due to respiratory motion. IMPRESSIONS  1. Left ventricular ejection fraction, by estimation, is 60 to 65%. The left ventricle has normal function. The left ventricle has no regional wall motion abnormalities. Left ventricular diastolic parameters were normal.  2. Right ventricular systolic function is normal. The right ventricular size is normal.  3. The mitral valve is normal in structure. Trivial mitral valve regurgitation. No evidence of mitral stenosis.  4. The aortic valve is tricuspid. Aortic valve regurgitation is not visualized. Aortic valve sclerosis is present, with no evidence of  aortic valve stenosis.  5. The  inferior vena cava is normal in size with greater than 50% respiratory variability, suggesting right atrial pressure of 3 mmHg. Comparison(s): No prior Echocardiogram. FINDINGS  Left Ventricle: Left ventricular ejection fraction, by estimation, is 60 to 65%. The left ventricle has normal function. The left ventricle has no regional wall motion abnormalities. The left ventricular internal cavity size was normal in size. There is  no left ventricular hypertrophy. Left ventricular diastolic parameters were normal. Right Ventricle: The right ventricular size is normal. Right ventricular systolic function is normal. Left Atrium: Left atrial size was normal in size. Right Atrium: Right atrial size was normal in size. Pericardium: There is no evidence of pericardial effusion. Mitral Valve: The mitral valve is normal in structure. Trivial mitral valve regurgitation. No evidence of mitral valve stenosis. Tricuspid Valve: The tricuspid valve is normal in structure. Tricuspid valve regurgitation is trivial. No evidence of tricuspid stenosis. Aortic Valve: The aortic valve is tricuspid. Aortic valve regurgitation is not visualized. Aortic valve sclerosis is present, with no evidence of aortic valve stenosis. Aortic valve mean gradient measures 3.0 mmHg. Aortic valve peak gradient measures 6.2  mmHg. Aortic valve area, by VTI measures 1.90 cm. Pulmonic Valve: The pulmonic valve was not well visualized. Pulmonic valve regurgitation is trivial. No evidence of pulmonic stenosis. Aorta: The aortic root is normal in size and structure. Venous: The inferior vena cava is normal in size with greater than 50% respiratory variability, suggesting right atrial pressure of 3 mmHg. IAS/Shunts: No atrial level shunt detected by color flow Doppler.  LEFT VENTRICLE PLAX 2D LVIDd:         4.40 cm   Diastology LVIDs:         3.20 cm   LV e' medial:    8.81 cm/s LV PW:         0.70 cm   LV E/e' medial:  11.3 LV IVS:        0.70 cm   LV e' lateral:    9.03 cm/s LVOT diam:     1.60 cm   LV E/e' lateral: 11.0 LV SV:         50 LV SV Index:   27 LVOT Area:     2.01 cm  RIGHT VENTRICLE             IVC RV Basal diam:  2.70 cm     IVC diam: 1.40 cm RV S prime:     12.90 cm/s TAPSE (M-mode): 2.1 cm LEFT ATRIUM           Index        RIGHT ATRIUM          Index LA diam:      3.10 cm 1.70 cm/m   RA Area:     8.97 cm LA Vol (A2C): 24.2 ml 13.27 ml/m  RA Volume:   18.00 ml 9.87 ml/m LA Vol (A4C): 15.2 ml 8.33 ml/m  AORTIC VALVE AV Area (Vmax):    2.19 cm AV Area (Vmean):   1.94 cm AV Area (VTI):     1.90 cm AV Vmax:           125.00 cm/s AV Vmean:          82.500 cm/s AV VTI:            0.263 m AV Peak Grad:      6.2 mmHg AV Mean Grad:      3.0 mmHg LVOT Vmax:  136.00 cm/s LVOT Vmean:        79.800 cm/s LVOT VTI:          0.249 m LVOT/AV VTI ratio: 0.95  AORTA Ao Root diam: 2.70 cm MITRAL VALVE MV Area (PHT): 4.49 cm    SHUNTS MV Decel Time: 169 msec    Systemic VTI:  0.25 m MV E velocity: 99.40 cm/s  Systemic Diam: 1.60 cm MV A velocity: 70.40 cm/s MV E/A ratio:  1.41 Olga MillersBrian Crenshaw MD Electronically signed by Olga MillersBrian Crenshaw MD Signature Date/Time: 01/29/2023/2:40:27 PM    Final    CT Head Wo Contrast  Result Date: 01/28/2023 CLINICAL DATA:  Paresthesias, right knee pain with hypertension EXAM: CT HEAD WITHOUT CONTRAST TECHNIQUE: Contiguous axial images were obtained from the base of the skull through the vertex without intravenous contrast. RADIATION DOSE REDUCTION: This exam was performed according to the departmental dose-optimization program which includes automated exposure control, adjustment of the mA and/or kV according to patient size and/or use of iterative reconstruction technique. COMPARISON:  None Available. FINDINGS: Brain: No evidence of acute infarction, hemorrhage, hydrocephalus, extra-axial collection or mass lesion/mass effect. Vascular: No hyperdense vessel or unexpected calcification. Skull: Normal. Negative for fracture or focal  lesion. Sinuses/Orbits: No acute finding. Other: None. IMPRESSION: No acute intracranial pathology. Electronically Signed   By: Larose HiresImran  Ahmed D.O.   On: 01/28/2023 14:23   DG Chest 1 View  Result Date: 01/28/2023 CLINICAL DATA:  Dizziness with hypertension. EXAM: CHEST  1 VIEW COMPARISON:  Chest x-ray dated February 20, 2018. FINDINGS: The heart size and mediastinal contours are within normal limits. Both lungs are clear. The visualized skeletal structures are unremarkable. IMPRESSION: No active disease. Electronically Signed   By: Obie DredgeWilliam T Derry M.D.   On: 01/28/2023 12:36       Kathlen ModyVijaya Graycen Degan M.D. [facility] 01/29/2023, 6:21 PM  Available via Epic secure chat 7am-7pm After 7 pm, please refer to night coverage provider listed on amion.

## 2023-01-29 NOTE — Plan of Care (Signed)

## 2023-01-30 DIAGNOSIS — R030 Elevated blood-pressure reading, without diagnosis of hypertension: Secondary | ICD-10-CM | POA: Diagnosis not present

## 2023-01-30 DIAGNOSIS — R002 Palpitations: Secondary | ICD-10-CM

## 2023-01-30 DIAGNOSIS — E785 Hyperlipidemia, unspecified: Secondary | ICD-10-CM | POA: Diagnosis not present

## 2023-01-30 DIAGNOSIS — R7989 Other specified abnormal findings of blood chemistry: Secondary | ICD-10-CM | POA: Diagnosis not present

## 2023-01-30 MED ORDER — METOPROLOL TARTRATE 25 MG PO TABS: 12.5000 mg | ORAL_TABLET | Freq: Every day | ORAL | 0 refills | Status: DC | PRN

## 2023-01-30 NOTE — Plan of Care (Signed)

## 2023-01-30 NOTE — TOC Transition Note (Signed)
Transition of Care [facility]) - CM/SW Discharge Note   Patient Details  Name: Caitlyn Schmidt MRN: [id number] Date of Birth: 02/03/1961  Transition of [facility]) CM/SW Contact:  Leone Haven, RN Phone Number: 01/30/2023, 9:42 AM   Clinical Narrative:    Patient is for dc today, there is no TOC consult, has no needs.   Final next level of care: Home/Self Care     Patient Goals and CMS Choice      Discharge Placement                         Discharge Plan and Services Additional resources added to the After Visit Summary for                                       Social Determinants of Health (SDOH) Interventions SDOH Screenings   Food Insecurity: No Food Insecurity (01/29/2023)  Housing: Low Risk  (01/29/2023)  Transportation Needs: No Transportation Needs (01/29/2023)  Utilities: Not At Risk (01/29/2023)  Depression (PHQ2-9): Low Risk  (12/29/2022)  Tobacco Use: Low Risk  (01/28/2023)     Readmission Risk Interventions     No data to display

## 2023-02-02 NOTE — Discharge Summary (Addendum)
Physician Discharge Summary   Patient: Caitlyn Schmidt MRN: [id number] DOB: 1960/12/11  Admit date:     01/28/2023  Discharge date: 01/30/2023  Discharge Physician: Kathlen Mody   PCP: Corwin Levins, MD   Recommendations at discharge:  Please follow up with PCP in one week.  Please check cbc and bmp IN ONE WEEK.  Please follow up with cardiology for Holter monitor placement.   Discharge Diagnoses: Principal Problem:   Elevated troponin Active Problems:   Hyperlipidemia   Elevated BP without diagnosis of hypertension    Hospital Course: Caitlyn Schmidt is a 62 y.o. female with medical history significant of HLD, B12 deficiency who presents with concerns of elevated BP and paraesthesias of the feet.     Assessment and Plan:  Elevated troponin -pt present with vague non-specific symptoms of headache, extremity paresthesia and heart palpitation with ambulatory reading of SBP of 170 at home. Her BP was elevated on initial presentation to SBP of 180, which resolved.  -Her initial troponin was negative at 4 but has been trending upwards to 55-->82-->45.  - Repeat EKG. TSH wnl. CXR negative.  -Negative UDS and ETOH level - Echocardiogram ordered and unremarkable.  -cardiology was consulted by ED physician, no indication for further work as per cardiology as echo is unremarkable.    Elevated BP without diagnosis of hypertension Transient elevation in BP that have spontaneously resolved - suspect stress related.  - also gave her a prescription for  prn BP medication to be used on ly when she had elevated BP >150/90 and with palpitations.  - she also reported palpitations but telemetry does not show any abnormalities.  - recommend outpatient follow up with cardiology for holter monitor placement.    Hyperlipidemia -continue statin    Paraesthesias of the feet Resolved spontaneously.       Consultants: none.  Procedures performed: echo  Disposition: Home Diet recommendation:   Discharge Diet Orders (From admission, onward)     Start     Ordered   01/30/23 0000  Diet - low sodium heart healthy        01/30/23 0940           Regular diet DISCHARGE MEDICATION: Allergies as of 01/30/2023       Reactions   Prednisone    REACTION: Jittery        Medication List     STOP taking these medications    ASHWAGANDHA PO       TAKE these medications    CALCIUM SOFT CHEWS PO Take by mouth.   cholecalciferol 1000 units tablet Commonly known as: VITAMIN D Take 2,000 Units by mouth daily.   FISH OIL OMEGA-3 PO Take 1,400 mg by mouth daily in the afternoon.   metoprolol tartrate 25 MG tablet Commonly known as: LOPRESSOR Take 0.5 tablets (12.5 mg total) by mouth daily as needed (for BP>150/90 mmhg.).   multivitamin tablet Take 1 tablet by mouth daily.   rosuvastatin 20 MG tablet Commonly known as: Crestor Take 1 tablet (20 mg total) by mouth daily.        Follow-up Information     Schedule an appointment as soon as possible for a visit  with Corwin Levins, MD.   Specialties: Internal Medicine, Radiology Why: For recheck of your symptoms Contact information: [address] [place] [address] [phone]                Discharge Exam: Ceasar Mons Weights   01/28/23 1208  01/29/23 0114 01/29/23 0526  Weight: 69.9 kg 68.3 kg 66.9 kg   General exam: Appears calm and comfortable  Respiratory system: Clear to auscultation. Respiratory effort normal. Cardiovascular system: S1 & S2 heard, RRR. No JVD, murmurs, rubs, gallops or clicks. No pedal edema. Gastrointestinal system: Abdomen is nondistended, soft and nontender. No organomegaly or masses felt. Normal bowel sounds heard. Central nervous system: Alert and oriented. No focal neurological deficits. Extremities: Symmetric 5 x 5 power. Skin: No rashes, lesions or ulcers Psychiatry: Judgement and insight appear normal. Mood & affect appropriate.    Condition at discharge:  fair  The results of significant diagnostics from this hospitalization (including imaging, microbiology, ancillary and laboratory) are listed below for reference.   Imaging Studies: ECHOCARDIOGRAM COMPLETE  Result Date: 01/29/2023    ECHOCARDIOGRAM REPORT   Patient Name:   Caitlyn Schmidt Date of Exam: 01/29/2023 Medical Rec #:  [id number]       Height:       69.5 in Accession #:    [id number]      Weight:       147.5 lb Date of Birth:  31-Jan-1961       BSA:          1.824 m Patient Age:    61 years        BP:           114/55 mmHg Patient Gender: F               HR:           63 bpm. Exam Location:  Inpatient Procedure: 2D Echo, Cardiac Doppler, Color Doppler and 3D Echo Indications:    elevated troponin  History:        Patient has prior history of Echocardiogram examinations, most                 recent 03/10/2018. Risk Factors:Dyslipidemia.  Sonographer:    Delcie Roch RDCS Referring Phys: [id number] CHING T TU  Sonographer Comments: Image acquisition challenging due to respiratory motion. IMPRESSIONS  1. Left ventricular ejection fraction, by estimation, is 60 to 65%. The left ventricle has normal function. The left ventricle has no regional wall motion abnormalities. Left ventricular diastolic parameters were normal.  2. Right ventricular systolic function is normal. The right ventricular size is normal.  3. The mitral valve is normal in structure. Trivial mitral valve regurgitation. No evidence of mitral stenosis.  4. The aortic valve is tricuspid. Aortic valve regurgitation is not visualized. Aortic valve sclerosis is present, with no evidence of aortic valve stenosis.  5. The inferior vena cava is normal in size with greater than 50% respiratory variability, suggesting right atrial pressure of 3 mmHg. Comparison(s): No prior Echocardiogram. FINDINGS  Left Ventricle: Left ventricular ejection fraction, by estimation, is 60 to 65%. The left ventricle has normal function. The left ventricle has no  regional wall motion abnormalities. The left ventricular internal cavity size was normal in size. There is  no left ventricular hypertrophy. Left ventricular diastolic parameters were normal. Right Ventricle: The right ventricular size is normal. Right ventricular systolic function is normal. Left Atrium: Left atrial size was normal in size. Right Atrium: Right atrial size was normal in size. Pericardium: There is no evidence of pericardial effusion. Mitral Valve: The mitral valve is normal in structure. Trivial mitral valve regurgitation. No evidence of mitral valve stenosis. Tricuspid Valve: The tricuspid valve is normal in structure. Tricuspid valve regurgitation is trivial. No evidence of tricuspid  stenosis. Aortic Valve: The aortic valve is tricuspid. Aortic valve regurgitation is not visualized. Aortic valve sclerosis is present, with no evidence of aortic valve stenosis. Aortic valve mean gradient measures 3.0 mmHg. Aortic valve peak gradient measures 6.2  mmHg. Aortic valve area, by VTI measures 1.90 cm. Pulmonic Valve: The pulmonic valve was not well visualized. Pulmonic valve regurgitation is trivial. No evidence of pulmonic stenosis. Aorta: The aortic root is normal in size and structure. Venous: The inferior vena cava is normal in size with greater than 50% respiratory variability, suggesting right atrial pressure of 3 mmHg. IAS/Shunts: No atrial level shunt detected by color flow Doppler.  LEFT VENTRICLE PLAX 2D LVIDd:         4.40 cm   Diastology LVIDs:         3.20 cm   LV e' medial:    8.81 cm/s LV PW:         0.70 cm   LV E/e' medial:  11.3 LV IVS:        0.70 cm   LV e' lateral:   9.03 cm/s LVOT diam:     1.60 cm   LV E/e' lateral: 11.0 LV SV:         50 LV SV Index:   27 LVOT Area:     2.01 cm  RIGHT VENTRICLE             IVC RV Basal diam:  2.70 cm     IVC diam: 1.40 cm RV S prime:     12.90 cm/s TAPSE (M-mode): 2.1 cm LEFT ATRIUM           Index        RIGHT ATRIUM          Index LA diam:       3.10 cm 1.70 cm/m   RA Area:     8.97 cm LA Vol (A2C): 24.2 ml 13.27 ml/m  RA Volume:   18.00 ml 9.87 ml/m LA Vol (A4C): 15.2 ml 8.33 ml/m  AORTIC VALVE AV Area (Vmax):    2.19 cm AV Area (Vmean):   1.94 cm AV Area (VTI):     1.90 cm AV Vmax:           125.00 cm/s AV Vmean:          82.500 cm/s AV VTI:            0.263 m AV Peak Grad:      6.2 mmHg AV Mean Grad:      3.0 mmHg LVOT Vmax:         136.00 cm/s LVOT Vmean:        79.800 cm/s LVOT VTI:          0.249 m LVOT/AV VTI ratio: 0.95  AORTA Ao Root diam: 2.70 cm MITRAL VALVE MV Area (PHT): 4.49 cm    SHUNTS MV Decel Time: 169 msec    Systemic VTI:  0.25 m MV E velocity: 99.40 cm/s  Systemic Diam: 1.60 cm MV A velocity: 70.40 cm/s MV E/A ratio:  1.41 Olga Millers MD Electronically signed by Olga Millers MD Signature Date/Time: 01/29/2023/2:40:27 PM    Final    CT Head Wo Contrast  Result Date: 01/28/2023 CLINICAL DATA:  Paresthesias, right knee pain with hypertension EXAM: CT HEAD WITHOUT CONTRAST TECHNIQUE: Contiguous axial images were obtained from the base of the skull through the vertex without intravenous contrast. RADIATION DOSE REDUCTION: This exam was performed according to the departmental dose-optimization  program which includes automated exposure control, adjustment of the mA and/or kV according to patient size and/or use of iterative reconstruction technique. COMPARISON:  None Available. FINDINGS: Brain: No evidence of acute infarction, hemorrhage, hydrocephalus, extra-axial collection or mass lesion/mass effect. Vascular: No hyperdense vessel or unexpected calcification. Skull: Normal. Negative for fracture or focal lesion. Sinuses/Orbits: No acute finding. Other: None. IMPRESSION: No acute intracranial pathology. Electronically Signed   By: Larose HiresImran  Ahmed D.O.   On: 01/28/2023 14:23   DG Chest 1 View  Result Date: 01/28/2023 CLINICAL DATA:  Dizziness with hypertension. EXAM: CHEST  1 VIEW COMPARISON:  Chest x-ray dated February 20, 2018.  FINDINGS: The heart size and mediastinal contours are within normal limits. Both lungs are clear. The visualized skeletal structures are unremarkable. IMPRESSION: No active disease. Electronically Signed   By: Obie DredgeWilliam T Derry M.D.   On: 01/28/2023 12:36    Microbiology: Results for orders placed or performed in visit on 10/23/20  Novel Coronavirus, NAA ([facility])     Status: None   Collection Time: 10/23/20  9:17 AM   Specimen: Nasopharyngeal(NP) swabs in vial transport medium   Nasopharynge  Screenin  Result Value Ref Range Status   SARS-CoV-2, NAA Not Detected Not Detected Final    Comment: This nucleic acid amplification test was developed and its performance characteristics determined by [facility]. Nucleic acid amplification tests include RT-PCR and TMA. This test has not been FDA cleared or approved. This test has been authorized by FDA under an Emergency Use Authorization (EUA). This test is only authorized for the duration of time the declaration that circumstances exist justifying the authorization of the emergency use of in vitro diagnostic tests for detection of SARS-CoV-2 virus and/or diagnosis of COVID-19 infection under section 564(b)(1) of the Act, 21 U.S.C. 161WRU-0(A360bbb-3(b) (1), unless the authorization is terminated or revoked sooner. When diagnostic testing is negative, the possibility of a false negative result should be considered in the context of a patient's recent exposures and the presence of clinical signs and symptoms consistent with COVID-19. An individual without symptoms of COVID-19 and who is not shedding SARS-CoV-2 virus wo uld expect to have a negative (not detected) result in this assay.   SARS-COV-2, NAA 2 DAY TAT     Status: None   Collection Time: 10/23/20  9:17 AM   Nasopharynge  Screenin  Result Value Ref Range Status   SARS-CoV-2, NAA 2 DAY TAT Performed  Final    Labs: CBC: Recent Labs  Lab 01/28/23 1221  WBC 8.4  HGB 14.6  HCT  42.9  MCV 89.6  PLT 208   Basic Metabolic Panel: Recent Labs  Lab 01/28/23 1221  NA 139  K 3.5  CL 104  CO2 22  GLUCOSE 110*  BUN 11  CREATININE 0.98  CALCIUM 9.5   Liver Function Tests: No results for input(s): "AST", "ALT", "ALKPHOS", "BILITOT", "PROT", "ALBUMIN" in the last 168 hours. CBG: No results for input(s): "GLUCAP" in the last 168 hours.  Discharge time spent: 39 minutes.   Signed: Kathlen ModyVijaya Teila Skalsky, MD [facility] 02/02/2023

## 2023-02-09 ENCOUNTER — Ambulatory Visit: Payer: BC Managed Care – PPO | Admitting: Internal Medicine

## 2023-02-09 VITALS — BP 124/72 | HR 60 | Temp 98.0°F | Ht 69.5 in | Wt 153.0 lb

## 2023-02-09 DIAGNOSIS — R7989 Other specified abnormal findings of blood chemistry: Secondary | ICD-10-CM | POA: Diagnosis not present

## 2023-02-09 DIAGNOSIS — H359 Unspecified retinal disorder: Secondary | ICD-10-CM

## 2023-02-09 DIAGNOSIS — F419 Anxiety disorder, unspecified: Secondary | ICD-10-CM | POA: Insufficient documentation

## 2023-02-09 DIAGNOSIS — R030 Elevated blood-pressure reading, without diagnosis of hypertension: Secondary | ICD-10-CM

## 2023-02-09 NOTE — Assessment & Plan Note (Addendum)
Mild situational recent for unclear reasons, not likely to be major factor in recent episode of elevated BP, declines need for counseling or other med tx at this time

## 2023-02-09 NOTE — Progress Notes (Signed)
Patient ID: Caitlyn Schmidt, female   DOB: [birth year], 62 y.o.   MRN: [id number]        Chief Complaint: follow up recent Hospn apr 5 - Jan 30 2023 with elevated troponin, elevated BP without htn, anxiety       HPI:  MARQUIS Schmidt is a 62 y.o. female here with above, one day after an eye exam with mention to her of a small retinal bleeding to the right eye; BP there mild high SBP in the 140s, asked to check her BP at home and f/u with eye exam repeat in 3 mo per optho.  Has hx of situational anxiety   BP at home later was 177/110. She felt dizzy lightheaded, and asked to go to ED when she reported her symptoms here.  She has been checking BP sometimes with wrist cuff and sometimes with arm cuff, always the left arm, Wrist BP's tend to be several points higher overall it seems.  Arm BP monitor range 114/82 and most often less than 140/90 though has 2 readings in the 140's in the last 3 days.  Now home walking 30 min per day, better sleep, trying to avoid stress with work and fortunately has no other significant other issues.  Pt denies chest pain, increased sob or doe, wheezing, orthopnea, PND, increased LE swelling, palpitations, dizziness or syncope.   Pt denies polydipsia, polyuria, or new focal neuro s/s.    Pt denies fever, wt loss, night sweats, loss of appetite, or other constitutional symptoms  Denies worsening depressive symptoms, suicidal ideation, or panic       Wt Readings from Last 3 Encounters:  02/09/23 153 lb (69.4 kg)  01/29/23 147 lb 8 oz (66.9 kg)  12/29/22 150 lb (68 kg)   BP Readings from Last 3 Encounters:  02/09/23 124/72  01/30/23 117/62  12/29/22 118/66         Past Medical History:  Diagnosis Date   ADD (attention deficit disorder)    Allergic rhinitis, mild    Essential hypertension 06/04/2015   Hyperlipidemia    IBS (irritable bowel syndrome)    Melanoma 1995   Past Surgical History:  Procedure Laterality Date   COLPOSCOPY     WRIST SURGERY  2010    reports  that she has never smoked. She has never used smokeless tobacco. She reports current alcohol use. She reports that she does not use drugs. family history includes Cancer in her maternal grandmother, mother, and paternal grandfather; Diabetes in her maternal grandfather and maternal grandmother; Gout in her maternal grandfather; Heart disease in her maternal grandfather, paternal grandfather, and paternal grandmother; Hyperlipidemia in her father; Hypertension in her father, maternal grandmother, and mother; Kidney disease in her maternal grandfather; Macular degeneration in her maternal grandmother; Osteoporosis in her maternal grandmother; Thyroid disease in her mother. Allergies  Allergen Reactions   Prednisone     REACTION: Jittery   Current Outpatient Medications on File Prior to Visit  Medication Sig Dispense Refill   Calcium-Vitamin D-Vitamin K (CALCIUM SOFT CHEWS PO) Take by mouth.     cholecalciferol (VITAMIN D) 1000 UNITS tablet Take 2,000 Units by mouth daily.     metoprolol tartrate (LOPRESSOR) 25 MG tablet Take 0.5 tablets (12.5 mg total) by mouth daily as needed (for BP>150/90 mmhg.). 30 tablet 0   Multiple Vitamin (MULTIVITAMIN) tablet Take 1 tablet by mouth daily.     Omega-3 Fatty Acids (FISH OIL OMEGA-3 PO) Take 1,400 mg by mouth daily in the  afternoon.     rosuvastatin (CRESTOR) 20 MG tablet Take 1 tablet (20 mg total) by mouth daily. 90 tablet 3   No current facility-administered medications on file prior to visit.        ROS:  All others reviewed and negative.  Objective        PE:  BP 124/72 (BP Location: Left Arm, Patient Position: Sitting, Cuff Size: Normal)   Pulse 60   Temp 98 F (36.7 C) (Oral)   Ht 5' 9.5" (1.765 m)   Wt 153 lb (69.4 kg)   SpO2 100%   BMI 22.27 kg/m                 Constitutional: Pt appears in NAD               HENT: Head: NCAT.                Right Ear: External ear normal.                 Left Ear: External ear normal.                 Eyes: . Pupils are equal, round, and reactive to light. Conjunctivae and EOM are normal               Nose: without d/c or deformity               Neck: Neck supple. Gross normal ROM               Cardiovascular: Normal rate and regular rhythm.                 Pulmonary/Chest: Effort normal and breath sounds without rales or wheezing.                Abd:  Soft, NT, ND, + BS, no organomegaly               Neurological: Pt is alert. At baseline orientation, motor grossly intact               Skin: Skin is warm. No rashes, no other new lesions, LE edema - none               Psychiatric: Pt behavior is normal without agitation   Micro: none  Cardiac tracings I have personally interpreted today:  none  Pertinent Radiological findings (summarize): none   Lab Results  Component Value Date   WBC 8.4 01/28/2023   HGB 14.6 01/28/2023   HCT 42.9 01/28/2023   PLT 208 01/28/2023   GLUCOSE 110 (H) 01/28/2023   CHOL 147 12/23/2022   TRIG 110.0 12/23/2022   HDL 65.40 12/23/2022   LDLDIRECT 80.0 06/27/2018   LDLCALC 59 12/23/2022   ALT 15 12/23/2022   AST 20 12/23/2022   NA 139 01/28/2023   K 3.5 01/28/2023   CL 104 01/28/2023   CREATININE 0.98 01/28/2023   BUN 11 01/28/2023   CO2 22 01/28/2023   TSH 1.986 01/28/2023   Assessment/Plan:  [name] is a 62 y.o. White or Caucasian [1] female with  has a past medical history of ADD (attention deficit disorder), Allergic rhinitis, mild, Essential hypertension (06/04/2015), Hyperlipidemia, IBS (irritable bowel syndrome), and Melanoma (1995).  Anxiety Mild situational recent for unclear reasons, not likely to be major factor in recent episode of elevated BP, declines need for counseling or other med tx at this time  Elevated BP without diagnosis of  hypertension Mild occasional elevation per arm cuff likely situational and mostly < 130/90; pt has lopressor 12.5 bid prn per cardiology, I would not encourage further antihtn tx at this time due  to risk of overtreatment and hypotension, but continue to follow closely at home with arm cuff only, and next visit with cardiology  Elevated troponin Etiology unclear, has f/u with cardiology as planned  Retina disorder Also for f/u optho as planned  Followup: Return in about 11 months (around 12/30/2023).  Oliver Barre, MD 02/12/2023 6:53 AM [facility] Internal Medicine

## 2023-02-09 NOTE — Patient Instructions (Signed)
Please continue to check your BP at home as you mentioned  Please continue all other medications as before, and refills have been done if requested.  Please have the pharmacy call with any other refills you may need.  Please continue your efforts at being more active, low cholesterol diet, and weight control  Please keep your appointments with your specialists as you may have planned - cardiology and eye doctor  No further lab work needed today

## 2023-02-12 DIAGNOSIS — H359 Unspecified retinal disorder: Secondary | ICD-10-CM | POA: Insufficient documentation

## 2023-02-12 NOTE — Assessment & Plan Note (Addendum)
Mild occasional elevation per arm cuff likely situational and mostly < 130/90; pt has lopressor 12.5 bid prn per cardiology, I would not encourage further antihtn tx at this time due to risk of overtreatment and hypotension, but continue to follow closely at home with arm cuff only, and next visit with cardiology

## 2023-02-12 NOTE — Assessment & Plan Note (Signed)
Etiology unclear, has f/u with cardiology as planned

## 2023-02-12 NOTE — Assessment & Plan Note (Signed)
Also for f/u optho as planned

## 2023-03-14 NOTE — Progress Notes (Unsigned)
Cardiology Office Note:    Date:  03/15/2023   ID:  BRESHA VILE, DOB 11-Feb-1961, MRN [id number]  PCP:  Corwin Levins, MD   [facility] Providers Cardiologist:   Kandi Brusseau   Referring MD: Corwin Levins, MD   Chief Complaint  Patient presents with   Palpitations   Hyperlipidemia    History of Present Illness:    Caitlyn Schmidt is a 62 y.o. female with a hx of ADD  , HLD  [name] is seen for eval of an episode  In early April, felt unwell Like she was going to pass out Legs and feet felt numb.  BP was very elevated that day  Went to the ER Was given metoprolol to take if her BP is elevated but she has not needed it yet  Troponins were mildly elevated ( 55,82, 61, 45)   Echo : Normal LV systolic  60-65%,  normal RV  Trivial Tricuspid regurgitation    Coronary calcium score from May, 2023 was 0  BP is normally very well controlled.   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   cholecalciferol (VITAMIN D) 1000 UNITS tablet Take 4,000 Units by mouth daily.   metoprolol tartrate (LOPRESSOR) 100 MG tablet Take one tablet by mouth 2 hours prior to your Cardiac CT.   Multiple Vitamin (MULTIVITAMIN) tablet Take 1 tablet by mouth daily.   Omega-3 Fatty Acids (FISH  OIL OMEGA-3 PO) Take 1,400 mg by mouth daily in the afternoon.   rosuvastatin (CRESTOR) 20 MG tablet Take 1 tablet (20 mg total) by mouth daily.     Allergies:   Prednisone   Social History   Socioeconomic History   Marital status: Divorced    Spouse name: Not on file   Number of children: Not on file   Years of education: Not on file   Highest education level: Bachelor's degree (e.g., BA, AB, BS)  Occupational History   Not on file  Tobacco Use   Smoking status: Never   Smokeless tobacco: Never  Substance and Sexual Activity   Alcohol use: Yes    Alcohol/week: 0.0 standard drinks of alcohol   Drug use: No   Sexual activity: Not Currently    Birth control/protection: Post-menopausal  Other Topics Concern   Not on file  Social History Narrative   Not on file   Social Determinants of Health   Financial Resource Strain: Low Risk  (02/09/2023)   Overall Financial Resource Strain (CARDIA)    Difficulty of Paying Living Expenses: Not very hard  Food Insecurity: No Food Insecurity (02/09/2023)   Hunger Vital Sign    Worried About Running Out of  Food in the Last Year: Never true    Ran Out of Food in the Last Year: Never true  Transportation Needs: No Transportation Needs (02/09/2023)   PRAPARE - Administrator, Civil Service (Medical): No    Lack of Transportation (Non-Medical): No  Physical Activity: Insufficiently Active (02/09/2023)   Exercise Vital Sign    Days of Exercise per Week: 5 days    Minutes of Exercise per Session: 10 min  Stress: Stress Concern Present (02/09/2023)   [employer] of Occupational Health - Occupational Stress Questionnaire    Feeling of Stress : Rather much  Social Connections: Unknown (02/09/2023)   Social Connection and Isolation Panel [NHANES]    Frequency of Communication with Friends and Family: More than three times a week    Frequency of Social Gatherings with Friends and Family: More than three times a week    Attends  Religious Services: More than 4 times per year    Active Member of [employer] or Organizations: Patient declined    Attends Engineer, structural: Not on file    Marital Status: Divorced     Family History: The patient's family history includes Cancer in her maternal grandmother, mother, and paternal grandfather; Diabetes in her maternal grandfather and maternal grandmother; Gout in her maternal grandfather; Heart disease in her maternal grandfather, paternal grandfather, and paternal grandmother; Hyperlipidemia in her father; Hypertension in her father, maternal grandmother, and mother; Kidney disease in her maternal grandfather; Macular degeneration in her maternal grandmother; Osteoporosis in her maternal grandmother; Thyroid disease in her mother.  ROS:   Please see the history of present illness.     All other systems reviewed and are negative.  EKGs/Labs/Other Studies Reviewed:    The following studies were reviewed today:   EKG:   01/28/23:  NSR . No ST or T wave changes   Recent Labs: 12/23/2022: ALT 15 01/28/2023: BUN 11; Creatinine, Ser 0.98; Hemoglobin 14.6; Platelets 208; Potassium 3.5; Sodium 139; TSH 1.986  Recent Lipid Panel    Component Value Date/Time   CHOL 147 12/23/2022 0737   TRIG 110.0 12/23/2022 0737   HDL 65.40 12/23/2022 0737   CHOLHDL 2 12/23/2022 0737   VLDL 22.0 12/23/2022 0737   LDLCALC 59 12/23/2022 0737   LDLDIRECT 80.0 06/27/2018 1715     Risk Assessment/Calculations:                Physical Exam:    VS:  BP 132/78   Pulse 72   Ht 5' 9.5" (1.765 m)   Wt 154 lb 12.8 oz (70.2 kg)   SpO2 95%   BMI 22.53 kg/m     Wt Readings from Last 3 Encounters:  03/15/23 154 lb 12.8 oz (70.2 kg)  02/09/23 153 lb (69.4 kg)  01/29/23 147 lb 8 oz (66.9 kg)     GEN:  Well nourished, well developed in no acute distress HEENT: Normal NECK: No JVD; No carotid bruits LYMPHATICS: No lymphadenopathy CARDIAC: RRR, no murmurs, rubs,  gallops RESPIRATORY:  Clear to auscultation without rales, wheezing or rhonchi  ABDOMEN: Soft, non-tender, non-distended MUSCULOSKELETAL:  No edema; No deformity  SKIN: Warm and dry NEUROLOGIC:  Alert and oriented x 3 PSYCHIATRIC:  Normal affect   ASSESSMENT:    1. Chest pain of uncertain etiology   2. Precordial pain    PLAN:    In order of problems listed above:   Chest pain :   .  Had an episode of CP, feeling  of unwell.  Went to er ,   Troponins elevated x 4 Echo the following day was unremarkable , normal LV and RV function   Will repeat troponin today  Consider MRI to look for myocarditis or other issue if Trop is still elevated   - coronary CTA   Will see her in 2-3 months            Medication Adjustments/Labs and Tests Ordered: Current medicines are reviewed at length with the patient today.  Concerns regarding medicines are outlined above.  Orders Placed This Encounter  Procedures   CT CORONARY MORPH W/CTA COR W/SCORE W/CA W/CM &/OR WO/CM   Troponin T   Meds ordered this encounter  Medications   metoprolol tartrate (LOPRESSOR) 100 MG tablet    Sig: Take one tablet by mouth 2 hours prior to your Cardiac CT.    Dispense:  180 tablet    Refill:  3    Patient Instructions  Medication Instructions:  Your physician recommends that you continue on your current medications as directed. Please refer to the Current Medication list given to you today.  *If you need a refill on your cardiac medications before your next appointment, please call your pharmacy*  Lab Work: If you have labs (blood work) drawn today and your tests are completely normal, you will receive your results only by: MyChart Message (if you have MyChart) OR A paper copy in the mail If you have any lab test that is abnormal or we need to change your treatment, we will call you to review the results.  Testing/Procedures: Your physician has requested that you have cardiac CT. Cardiac  computed tomography (CT) is a painless test that uses an x-ray machine to take clear, detailed pictures of your heart. For further information please visit [email or web address] Please follow instruction sheet as given.  Follow-Up: At [facility], you and your health needs are our priority.  As part of our continuing mission to provide you with exceptional heart care, we have created designated Provider Care Teams.  These Care Teams include your primary Cardiologist (physician) and Advanced Practice Providers (APPs -  Physician Assistants and Nurse Practitioners) who all work together to provide you with the care you need, when you need it.  We recommend signing up for the patient portal called "MyChart".  Sign up information is provided on this After Visit Summary.  MyChart is used to connect with patients for Virtual Visits (Telemedicine).  Patients are able to view lab/test results, encounter notes, upcoming appointments, etc.  Non-urgent messages can be sent to your provider as well.   To learn more about what you can do with MyChart, go to [email or web address].    Your next appointment:   2 to 3  month(s)  Provider:   Kristeen Miss, MD     Other Instructions   Your cardiac CT will be scheduled at one of the below locations:   [facility] [address] [place], [place] [address] [phone]  If scheduled at [facility], please arrive at the [facility] and Children's Entrance (Entrance C2) of [facility] 30 minutes prior to test start time. You can use the FREE valet parking offered at entrance C (encouraged to control the heart rate for the test)  Proceed to the [facility] Radiology Department (first floor) to check-in and test prep.  All radiology patients and guests should use entrance C2 at [facility], accessed from [facility], even though the  hospital's physical address listed is [address].     Please  follow these instructions carefully (unless otherwise directed):  Hold all erectile dysfunction medications at least 3 days (72 hrs) prior to test. (Ie viagra, cialis, sildenafil, tadalafil, etc) We will administer nitroglycerin during this exam.   On the Night Before the Test: Be sure to Drink plenty of water. Do not consume any caffeinated/decaffeinated beverages or chocolate 12 hours prior to your test. Do not take any antihistamines 12 hours prior to your test.  On the Day of the Test: Drink plenty of water until 1 hour prior to the test. Do not eat any food 1 hour prior to test. You may take your regular medications prior to the test.  Take metoprolol (Lopressor) 100 mg two hours prior to test. FEMALES- please wear underwire-free bra if available, avoid dresses & tight clothing       After the Test: Drink plenty of water. After receiving IV contrast, you may experience a mild flushed feeling. This is normal. On occasion, you may experience a mild rash up to 24 hours after the test. This is not dangerous. If this occurs, you can take Benadryl 25 mg and increase your fluid intake. If you experience trouble breathing, this can be serious. If it is severe call 911 IMMEDIATELY. If it is mild, please call our office.   We will call to schedule your test 2-4 weeks out understanding that some insurance companies will need an authorization prior to the service being performed.   For non-scheduling related questions, please contact the cardiac imaging nurse navigator should you have any questions/concerns: Rockwell Alexandria, Cardiac Imaging Nurse Navigator Larey Brick, Cardiac Imaging Nurse Navigator [facility] Direct Office Dial: [phone]   For scheduling needs, including cancellations and rescheduling, please call [place], [phone].    Signed, Kristeen Miss, MD  03/15/2023 5:15 PM    [facility]

## 2023-03-15 ENCOUNTER — Other Ambulatory Visit: Payer: Self-pay | Admitting: Cardiovascular Disease

## 2023-03-15 ENCOUNTER — Ambulatory Visit: Payer: BC Managed Care – PPO | Attending: Cardiovascular Disease | Admitting: Cardiovascular Disease

## 2023-03-15 ENCOUNTER — Encounter: Payer: Self-pay | Admitting: Cardiovascular Disease

## 2023-03-15 VITALS — BP 132/78 | HR 72 | Ht 69.5 in | Wt 154.8 lb

## 2023-03-15 DIAGNOSIS — R072 Precordial pain: Secondary | ICD-10-CM | POA: Diagnosis not present

## 2023-03-15 DIAGNOSIS — R079 Chest pain, unspecified: Secondary | ICD-10-CM

## 2023-03-15 MED ORDER — METOPROLOL TARTRATE 100 MG PO TABS: ORAL_TABLET | ORAL | 3 refills | Status: DC

## 2023-03-15 NOTE — Patient Instructions (Addendum)
Medication Instructions:  Your physician recommends that you continue on your current medications as directed. Please refer to the Current Medication list given to you today.  *If you need a refill on your cardiac medications before your next appointment, please call your pharmacy*  Lab Work: If you have labs (blood work) drawn today and your tests are completely normal, you will receive your results only by: MyChart Message (if you have MyChart) OR A paper copy in the mail If you have any lab test that is abnormal or we need to change your treatment, we will call you to review the results.  Testing/Procedures: Your physician has requested that you have cardiac CT. Cardiac computed tomography (CT) is a painless test that uses an x-ray machine to take clear, detailed pictures of your heart. For further information please visit [email or web address] Please follow instruction sheet as given.  Follow-Up: At [facility], you and your health needs are our priority.  As part of our continuing mission to provide you with exceptional heart care, we have created designated Provider Care Teams.  These Care Teams include your primary Cardiologist (physician) and Advanced Practice Providers (APPs -  Physician Assistants and Nurse Practitioners) who all work together to provide you with the care you need, when you need it.  We recommend signing up for the patient portal called "MyChart".  Sign up information is provided on this After Visit Summary.  MyChart is used to connect with patients for Virtual Visits (Telemedicine).  Patients are able to view lab/test results, encounter notes, upcoming appointments, etc.  Non-urgent messages can be sent to your provider as well.   To learn more about what you can do with MyChart, go to [email or web address].    Your next appointment:   2 to 3  month(s)  Provider:   Kristeen Miss, MD     Other Instructions   Your cardiac CT will be scheduled at one  of the below locations:   [facility], In [address] [place], [place] [address] [phone]  If scheduled at [facility], please arrive at the [facility] and Children's Entrance (Entrance C2) of [facility] 30 minutes prior to test start time. You can use the FREE valet parking offered at entrance C (encouraged to control the heart rate for the test)  Proceed to the [facility] Radiology Department (first floor) to check-in and test prep.  All radiology patients and guests should use entrance C2 at [facility], accessed from [facility], even though the hospital's physical address listed is [address].     Please follow these instructions carefully (unless otherwise directed):  Hold all erectile dysfunction medications at least 3 days (72 hrs) prior to test. (Ie viagra, cialis, sildenafil, tadalafil, etc) We will administer nitroglycerin during this exam.   On the Night Before the Test: Be sure to Drink plenty of water. Do not consume any caffeinated/decaffeinated beverages or chocolate 12 hours prior to your test. Do not take any antihistamines 12 hours prior to your test.  On the Day of the Test: Drink plenty of water until 1 hour prior to the test. Do not eat any food 1 hour prior to test. You may take your regular medications prior to the test.  Take metoprolol (Lopressor) 100 mg two hours prior to test. FEMALES- please wear underwire-free bra if available, avoid dresses & tight clothing       After the Test: Drink plenty of water. After receiving IV  contrast, you may experience a mild flushed feeling. This is normal. On occasion, you may experience a mild rash up to 24 hours after the test. This is not dangerous. If this occurs, you can take Benadryl 25 mg and increase your fluid intake. If you experience trouble breathing, this can be serious. If it is severe call 911 IMMEDIATELY. If it is mild, please call our  office.   We will call to schedule your test 2-4 weeks out understanding that some insurance companies will need an authorization prior to the service being performed.   For non-scheduling related questions, please contact the cardiac imaging nurse navigator should you have any questions/concerns: Rockwell Alexandria, Cardiac Imaging Nurse Navigator Larey Brick, Cardiac Imaging Nurse Navigator  [facility] Direct Office Dial: [phone]   For scheduling needs, including cancellations and rescheduling, please call [place], [phone].

## 2023-03-16 LAB — TROPONIN T: Troponin T (Highly Sensitive): <6 ng/L (ref 0–14)

## 2023-03-29 DIAGNOSIS — L821 Other seborrheic keratosis: Secondary | ICD-10-CM | POA: Diagnosis not present

## 2023-05-03 ENCOUNTER — Encounter (HOSPITAL_COMMUNITY): Payer: Self-pay

## 2023-05-05 ENCOUNTER — Ambulatory Visit (HOSPITAL_COMMUNITY)
Admission: RE | Admit: 2023-05-05 | Discharge: 2023-05-05 | Disposition: A | Discharge status: Home / Self Care | Payer: BC Managed Care – PPO | Source: Ambulatory Visit | Attending: Cardiovascular Disease | Admitting: Cardiovascular Disease

## 2023-05-05 DIAGNOSIS — I209 Angina pectoris, unspecified: Secondary | ICD-10-CM | POA: Diagnosis not present

## 2023-05-05 DIAGNOSIS — R072 Precordial pain: Secondary | ICD-10-CM | POA: Insufficient documentation

## 2023-05-05 MED ORDER — NITROGLYCERIN 0.4 MG SL SUBL
SUBLINGUAL_TABLET | SUBLINGUAL | Status: AC
  Filled 2023-05-05: qty 2

## 2023-05-05 MED ORDER — NITROGLYCERIN 0.4 MG SL SUBL
0.8000 mg | SUBLINGUAL_TABLET | Freq: Once | SUBLINGUAL | Status: AC
  Administered 2023-05-05: 0.8 mg via SUBLINGUAL

## 2023-05-05 MED ORDER — IOHEXOL 350 MG/ML SOLN
95.0000 mL | Freq: Once | INTRAVENOUS | Status: AC | PRN
  Administered 2023-05-05: 95 mL via INTRAVENOUS

## 2023-05-12 DIAGNOSIS — H3562 Retinal hemorrhage, left eye: Secondary | ICD-10-CM | POA: Diagnosis not present

## 2023-05-13 ENCOUNTER — Encounter: Payer: Self-pay | Admitting: Internal Medicine

## 2023-05-13 ENCOUNTER — Ambulatory Visit: Payer: BC Managed Care – PPO | Admitting: Internal Medicine

## 2023-05-13 VITALS — BP 122/80 | HR 67 | Temp 98.2°F | Ht 69.5 in | Wt 154.0 lb

## 2023-05-13 DIAGNOSIS — F419 Anxiety disorder, unspecified: Secondary | ICD-10-CM | POA: Diagnosis not present

## 2023-05-13 DIAGNOSIS — R030 Elevated blood-pressure reading, without diagnosis of hypertension: Secondary | ICD-10-CM

## 2023-05-13 DIAGNOSIS — J309 Allergic rhinitis, unspecified: Secondary | ICD-10-CM | POA: Diagnosis not present

## 2023-05-13 DIAGNOSIS — H6991 Unspecified Eustachian tube disorder, right ear: Secondary | ICD-10-CM

## 2023-05-13 MED ORDER — GUAIFENESIN ER 600 MG PO TB12: 1200.0000 mg | ORAL_TABLET | Freq: Two times a day (BID) | ORAL | 1 refills | Status: DC | PRN

## 2023-05-13 MED ORDER — TRIAMCINOLONE ACETONIDE 55 MCG/ACT NA AERO: 2.0000 | INHALATION_SPRAY | Freq: Every day | NASAL | 12 refills | Status: DC

## 2023-05-13 MED ORDER — CETIRIZINE HCL 10 MG PO TABS: 10.0000 mg | ORAL_TABLET | Freq: Every day | ORAL | 11 refills | Status: DC

## 2023-05-13 NOTE — Progress Notes (Unsigned)
Patient ID: Caitlyn Schmidt, female   DOB: 1961-04-19, 62 y.o.   MRN: [id number]        Chief Complaint: follow up right ear pain and fullness       HPI:  Caitlyn Schmidt is a 62 y.o. female here with c/o 1 wk onset right ear discomfort pressure like mild intermittent with popping and crackling and some discomfort to below the ear at the neck as well,  Does have several wks ongoing nasal allergy symptoms with clearish congestion, itch and sneezing, without fever, pain, ST, cough, swelling or wheezing.   Pt denies fever, wt loss, night sweats, loss of appetite, or other constitutional symptoms  Pt denies chest pain, increased sob or doe, wheezing, orthopnea, PND, increased LE swelling, palpitations, dizziness or syncope.  Denies worsening depressive symptoms, suicidal ideation, or panic; has ongoing anxiety       Wt Readings from Last 3 Encounters:  05/13/23 154 lb (69.9 kg)  03/15/23 154 lb 12.8 oz (70.2 kg)  02/09/23 153 lb (69.4 kg)   BP Readings from Last 3 Encounters:  05/13/23 122/80  05/05/23 (!) 106/58  03/15/23 132/78         Past Medical History:  Diagnosis Date   ADD (attention deficit disorder)    Allergic rhinitis, mild    Essential hypertension 06/04/2015   Hyperlipidemia    IBS (irritable bowel syndrome)    Melanoma (HCC) 1995   Past Surgical History:  Procedure Laterality Date   COLPOSCOPY     WRIST SURGERY  2010    reports that she has never smoked. She has never used smokeless tobacco. She reports current alcohol use. She reports that she does not use drugs. family history includes Cancer in her maternal grandmother, mother, and paternal grandfather; Diabetes in her maternal grandfather and maternal grandmother; Gout in her maternal grandfather; Heart disease in her maternal grandfather, paternal grandfather, and paternal grandmother; Hyperlipidemia in her father; Hypertension in her father, maternal grandmother, and mother; Kidney disease in her maternal grandfather;  Macular degeneration in her maternal grandmother; Osteoporosis in her maternal grandmother; Thyroid disease in her mother. Allergies  Allergen Reactions   Prednisone     REACTION: Jittery   Current Outpatient Medications on File Prior to Visit  Medication Sig Dispense Refill   Calcium-Vitamin D-Vitamin K (CALCIUM SOFT CHEWS PO) Take by mouth.     cholecalciferol (VITAMIN D) 1000 UNITS tablet Take 4,000 Units by mouth daily.     metoprolol tartrate (LOPRESSOR) 100 MG tablet TAKE ONE TABLET BY MOUTH 2 HOURS PRIOR TO YOUR CARDIAC CT. 1 tablet 0   Multiple Vitamin (MULTIVITAMIN) tablet Take 1 tablet by mouth daily.     Omega-3 Fatty Acids (FISH OIL OMEGA-3 PO) Take 1,400 mg by mouth daily in the afternoon.     rosuvastatin (CRESTOR) 20 MG tablet Take 1 tablet (20 mg total) by mouth daily. 90 tablet 3   metoprolol tartrate (LOPRESSOR) 25 MG tablet Take 0.5 tablets (12.5 mg total) by mouth daily as needed (for BP>150/90 mmhg.). (Patient not taking: Reported on 03/15/2023) 30 tablet 0   No current facility-administered medications on file prior to visit.        ROS:  All others reviewed and negative.  Objective        PE:  BP 122/80 (BP Location: Left Arm, Patient Position: Sitting, Cuff Size: Normal)   Pulse 67   Temp 98.2 F (36.8 C) (Oral)   Ht 5' 9.5" (1.765 m)   Wt 154  lb (69.9 kg)   SpO2 99%   BMI 22.42 kg/m                 Constitutional: Pt appears in NAD               HENT: Head: NCAT.                Right Ear: External ear normal.  Bilat tm's with mild erythema.  Max sinus areas non tender.  Pharynx benign               Left Ear: External ear normal.                Eyes: . Pupils are equal, round, and reactive to light. Conjunctivae and EOM are normal               Nose: without d/c or deformity               Neck: Neck supple. Gross normal ROM               Cardiovascular: Normal rate and regular rhythm.                 Pulmonary/Chest: Effort normal and breath sounds  without rales or wheezing.                               Neurological: Pt is alert. At baseline orientation, motor grossly intact               Skin: Skin is warm. No rashes, no other new lesions, LE edema - bibe               Psychiatric: Pt behavior is normal without agitation   Micro: none  Cardiac tracings I have personally interpreted today:  none  Pertinent Radiological findings (summarize): none   Lab Results  Component Value Date   WBC 8.4 01/28/2023   HGB 14.6 01/28/2023   HCT 42.9 01/28/2023   PLT 208 01/28/2023   GLUCOSE 110 (H) 01/28/2023   CHOL 147 12/23/2022   TRIG 110.0 12/23/2022   HDL 65.40 12/23/2022   LDLDIRECT 80.0 06/27/2018   LDLCALC 59 12/23/2022   ALT 15 12/23/2022   AST 20 12/23/2022   NA 139 01/28/2023   K 3.5 01/28/2023   CL 104 01/28/2023   CREATININE 0.98 01/28/2023   BUN 11 01/28/2023   CO2 22 01/28/2023   TSH 1.986 01/28/2023   Assessment/Plan:  Caitlyn Schmidt is a 62 y.o. White or Caucasian [1] female with  has a past medical history of ADD (attention deficit disorder), Allergic rhinitis, mild, Essential hypertension (06/04/2015), Hyperlipidemia, IBS (irritable bowel syndrome), and Melanoma (HCC) (1995).  Acute dysfunction of right eustachian tube Non infectious related it seems, for mucinex bid prn  Allergic rhinitis Also for zyrtec 10 mg and nasacort asd  Anxiety Mild chronic persistent, declines need for change in tx at this time  Elevated BP without diagnosis of hypertension BP Readings from Last 3 Encounters:  05/13/23 122/80  05/05/23 (!) 106/58  03/15/23 132/78   Stable, pt to continue medical treatment lopressor 12.5 mg prn elevated BP   Followup: Return if symptoms worsen or fail to improve.  Caitlyn Barre, MD 05/15/2023 12:41 PM [facility] - [facility]

## 2023-05-13 NOTE — Patient Instructions (Signed)
You appear to have right eustachian tube dysfunction and pressure in the right middle ear  Please take all new medication as prescribed - the zyrtec 10 mg per day, nasacort as directed, and mucinex twice per day  Please continue all other medications as before, and refills have been done if requested.  Please have the pharmacy call with any other refills you may need.  Please keep your appointments with your specialists as you may have planned

## 2023-05-15 ENCOUNTER — Encounter: Payer: Self-pay | Admitting: Internal Medicine

## 2023-05-15 DIAGNOSIS — H6991 Unspecified Eustachian tube disorder, right ear: Secondary | ICD-10-CM | POA: Insufficient documentation

## 2023-05-15 NOTE — Assessment & Plan Note (Signed)
BP Readings from Last 3 Encounters:  05/13/23 122/80  05/05/23 (!) 106/58  03/15/23 132/78   Stable, pt to continue medical treatment lopressor 12.5 mg prn elevated BP

## 2023-05-15 NOTE — Assessment & Plan Note (Signed)
Also for zyrtec 10 mg and nasacort asd

## 2023-05-15 NOTE — Assessment & Plan Note (Signed)
Non infectious related it seems, for mucinex bid prn

## 2023-05-15 NOTE — Assessment & Plan Note (Signed)
Mild chronic persistent, declines need for change in tx at this time

## 2023-05-26 NOTE — Progress Notes (Signed)
Cardiology Office Note:    Date:  05/27/2023   ID:  Caitlyn Schmidt, DOB Aug 16, 1961, MRN [id number]  PCP:  Corwin Levins, MD   [facility] Providers Cardiologist:   Rodricus Candelaria   Referring MD: Corwin Levins, MD   No chief complaint on file.   History of Present Illness:    [name] is a 62 y.o. female with a hx of ADD  , HLD  Caitlyn Schmidt is seen for eval of an episode  In early April, felt unwell Lightheaded, heart racing,  presyncopal    Like she was going to pass out Legs and feet felt numb.  BP was very elevated that day  Went to the ER Was given metoprolol to take if her BP is elevated but she has not needed it yet  Troponins were mildly elevated ( 55,82, 61, 45)   Echo : Normal LV systolic  60-65%,  normal RV  Trivial Tricuspid regurgitation    Coronary calcium score from May, 2023 was 0  BP is normally very well controlled.   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 2, 2024 Caitlyn Schmidt is seen for follow up of her near syncopal episode, Felt unwell  Went to the ER,  troponin levels  were elevated x 4 Coronary CAT revealed  CAC score of 0  RCA:  normal LAD: normal LCx: normal    We discussed Cardiac MRI  Feels well now. No further symptoms   Given her lack of symptoms and her normal left ventricular function I do not think that it is necessary to do a cardiac MRI.  I do not think it would change her management.  At this point I suspect that she may have had an episode of myocarditis that has now cleared.  Her LV function remains normal.     Past Medical History:  Diagnosis Date   ADD (attention deficit disorder)    Allergic rhinitis, mild    Essential hypertension  06/04/2015   Hyperlipidemia    IBS (irritable bowel syndrome)    Melanoma (HCC) 1995    Past Surgical History:  Procedure Laterality Date   COLPOSCOPY     WRIST SURGERY  2010    Current Medications: Current Meds  Medication Sig   cholecalciferol (VITAMIN D) 1000 UNITS tablet Take 4,000 Units by mouth daily.   metoprolol tartrate (LOPRESSOR) 25 MG tablet Take 0.5 tablets (12.5 mg total) by mouth daily as needed (for BP>150/90 mmhg.).   Multiple Vitamin (MULTIVITAMIN) tablet Take 1 tablet by mouth daily.   Omega-3 Fatty Acids (FISH OIL OMEGA-3 PO) Take 1,400 mg by mouth daily in the afternoon.   rosuvastatin (CRESTOR) 20 MG tablet Take 1 tablet (20 mg total) by mouth daily.     Allergies:   Prednisone   Social History   Socioeconomic History   Marital status: Divorced    Spouse name: Not on file   Number of children: Not on file   Years of education: Not on file   Highest education level: Bachelor's degree (e.g., BA, AB, BS)  Occupational History   Not on file  Tobacco Use   Smoking status:  Never   Smokeless tobacco: Never  Substance and Sexual Activity   Alcohol use: Yes    Alcohol/week: 0.0 standard drinks of alcohol   Drug use: No   Sexual activity: Not Currently    Birth control/protection: Post-menopausal  Other Topics Concern   Not on file  Social History Narrative   Not on file   Social Determinants of Health   Financial Resource Strain: Low Risk  (02/09/2023)   Overall Financial Resource Strain (CARDIA)    Difficulty of Paying Living Expenses: Not very hard  Food Insecurity: No Food Insecurity (02/09/2023)   Hunger Vital Sign    Worried About Running Out of Food in the Last Year: Never true    Ran Out of Food in the Last Year: Never true  Transportation Needs: No Transportation Needs (02/09/2023)   PRAPARE - Administrator, Civil Service (Medical): No    Lack of Transportation (Non-Medical): No  Physical Activity: Insufficiently Active  (02/09/2023)   Exercise Vital Sign    Days of Exercise per Week: 5 days    Minutes of Exercise per Session: 10 min  Stress: Stress Concern Present (02/09/2023)   [employer] of Occupational Health - Occupational Stress Questionnaire    Feeling of Stress : Rather much  Social Connections: Unknown (02/09/2023)   Social Connection and Isolation Panel [NHANES]    Frequency of Communication with Friends and Family: More than three times a week    Frequency of Social Gatherings with Friends and Family: More than three times a week    Attends Religious Services: More than 4 times per year    Active Member of [employer] or Organizations: Patient declined    Attends Engineer, structural: Not on file    Marital Status: Divorced     Family History: The patient's family history includes Cancer in her maternal grandmother, mother, and paternal grandfather; Diabetes in her maternal grandfather and maternal grandmother; Gout in her maternal grandfather; Heart disease in her maternal grandfather, paternal grandfather, and paternal grandmother; Hyperlipidemia in her father; Hypertension in her father, maternal grandmother, and mother; Kidney disease in her maternal grandfather; Macular degeneration in her maternal grandmother; Osteoporosis in her maternal grandmother; Thyroid disease in her mother.  ROS:   Please see the history of present illness.     All other systems reviewed and are negative.  EKGs/Labs/Other Studies Reviewed:    The following studies were reviewed today:   EKG:       Recent Labs: 12/23/2022: ALT 15 01/28/2023: BUN 11; Creatinine, Ser 0.98; Hemoglobin 14.6; Platelets 208; Potassium 3.5; Sodium 139; TSH 1.986  Recent Lipid Panel    Component Value Date/Time   CHOL 147 12/23/2022 0737   TRIG 110.0 12/23/2022 0737   HDL 65.40 12/23/2022 0737   CHOLHDL 2 12/23/2022 0737   VLDL 22.0 12/23/2022 0737   LDLCALC 59 12/23/2022 0737   LDLDIRECT 80.0 06/27/2018 1715      Risk Assessment/Calculations:             Physical Exam:      Physical Exam: Blood pressure 138/82, pulse 62, height 5' 9.5" (1.765 m), weight 156 lb (70.8 kg), SpO2 97%.       GEN:  Well nourished, well developed in no acute distress HEENT: Normal NECK: No JVD; No carotid bruits LYMPHATICS: No lymphadenopathy CARDIAC: RRR ,  very soft systolic murmur  RESPIRATORY:  Clear to auscultation without rales, wheezing or rhonchi  ABDOMEN: Soft, non-tender, non-distended MUSCULOSKELETAL:  No edema;  No deformity  SKIN: Warm and dry NEUROLOGIC:  Alert and oriented x 3   ASSESSMENT:    1. Pre-syncope   2. Subacute viral myocarditis     PLAN:        Chest pain :     Had an episode of CP, feeling of unwell.  Went to er ,   Troponins elevated x 4 Echo the following day was unremarkable , normal LV and RV function   She is now feeling well.  She is not having any symptoms.  We discussed getting a cardiac MRI but at this point I do not think that it would change her management.  I suspect that she did have an episode of viral myocarditis this seems to be healed.  Will see her back on an as-needed basis.               Medication Adjustments/Labs and Tests Ordered: Current medicines are reviewed at length with the patient today.  Concerns regarding medicines are outlined above.  No orders of the defined types were placed in this encounter.  No orders of the defined types were placed in this encounter.   Patient Instructions  Medication Instructions:  Your physician recommends that you continue on your current medications as directed. Please refer to the Current Medication list given to you today.  *If you need a refill on your cardiac medications before your next appointment, please call your pharmacy*   Lab Work: NONE If you have labs (blood work) drawn today and your tests are completely normal, you will receive your results only by: MyChart Message  (if you have MyChart) OR A paper copy in the mail If you have any lab test that is abnormal or we need to change your treatment, we will call you to review the results.   Testing/Procedures: NONE   Follow-Up: At [facility], you and your health needs are our priority.  As part of our continuing mission to provide you with exceptional heart care, we have created designated Provider Care Teams.  These Care Teams include your primary Cardiologist (physician) and Advanced Practice Providers (APPs -  Physician Assistants and Nurse Practitioners) who all work together to provide you with the care you need, when you need it.  Your next appointment:   As Needed  Provider:   Kristeen Miss, MD        Signed, Kristeen Miss, MD  05/27/2023 5:26 PM    [facility]

## 2023-05-27 ENCOUNTER — Ambulatory Visit: Payer: BC Managed Care – PPO | Attending: Cardiovascular Disease | Admitting: Cardiovascular Disease

## 2023-05-27 ENCOUNTER — Encounter: Payer: Self-pay | Admitting: Cardiovascular Disease

## 2023-05-27 VITALS — BP 138/82 | HR 62 | Ht 69.5 in | Wt 156.0 lb

## 2023-05-27 DIAGNOSIS — I400 Infective myocarditis: Secondary | ICD-10-CM

## 2023-05-27 DIAGNOSIS — R55 Syncope and collapse: Secondary | ICD-10-CM | POA: Diagnosis not present

## 2023-05-27 DIAGNOSIS — I514 Myocarditis, unspecified: Secondary | ICD-10-CM | POA: Insufficient documentation

## 2023-05-27 NOTE — Patient Instructions (Signed)
Medication Instructions:  Your physician recommends that you continue on your current medications as directed. Please refer to the Current Medication list given to you today.  *If you need a refill on your cardiac medications before your next appointment, please call your pharmacy*   Lab Work: NONE If you have labs (blood work) drawn today and your tests are completely normal, you will receive your results only by: MyChart Message (if you have MyChart) OR A paper copy in the mail If you have any lab test that is abnormal or we need to change your treatment, we will call you to review the results.   Testing/Procedures: NONE   Follow-Up: At [facility], you and your health needs are our priority.  As part of our continuing mission to provide you with exceptional heart care, we have created designated Provider Care Teams.  These Care Teams include your primary Cardiologist (physician) and Advanced Practice Providers (APPs -  Physician Assistants and Nurse Practitioners) who all work together to provide you with the care you need, when you need it.  Your next appointment:   As Needed  Provider:   Kristeen Miss, MD

## 2023-08-29 DIAGNOSIS — L814 Other melanin hyperpigmentation: Secondary | ICD-10-CM | POA: Diagnosis not present

## 2023-08-29 DIAGNOSIS — D225 Melanocytic nevi of trunk: Secondary | ICD-10-CM | POA: Diagnosis not present

## 2023-08-29 DIAGNOSIS — Z85820 Personal history of malignant melanoma of skin: Secondary | ICD-10-CM | POA: Diagnosis not present

## 2023-08-29 DIAGNOSIS — L821 Other seborrheic keratosis: Secondary | ICD-10-CM | POA: Diagnosis not present

## 2023-10-06 ENCOUNTER — Ambulatory Visit: Payer: BC Managed Care – PPO | Admitting: Internal Medicine

## 2023-10-06 VITALS — BP 118/80 | HR 78 | Temp 98.4°F | Ht 69.5 in | Wt 155.0 lb

## 2023-10-06 DIAGNOSIS — H6691 Otitis media, unspecified, right ear: Secondary | ICD-10-CM | POA: Diagnosis not present

## 2023-10-06 DIAGNOSIS — J309 Allergic rhinitis, unspecified: Secondary | ICD-10-CM | POA: Diagnosis not present

## 2023-10-06 DIAGNOSIS — U071 COVID-19: Secondary | ICD-10-CM | POA: Diagnosis not present

## 2023-10-06 DIAGNOSIS — H6991 Unspecified Eustachian tube disorder, right ear: Secondary | ICD-10-CM | POA: Diagnosis not present

## 2023-10-06 MED ORDER — AZITHROMYCIN 250 MG PO TABS: ORAL_TABLET | ORAL | 1 refills | Status: AC

## 2023-10-06 NOTE — Progress Notes (Signed)
Chief Complaint: follow up right ear pain, recent covid exposure, allergies and eustachian dysfxn       HPI:  Caitlyn Schmidt is a 62 y.o. female here overall doing ok but did have recent covid exposure from coworker and uri symptoms x 1 wk,   Does have several wks ongoing nasal allergy symptoms with clearish congestion, itch and sneezing, without ST, cough, swelling or wheezing.  Also has right ear popping and crackling at times and now severe pain stabbing at times in the past 2 days with feverish, ill feeling.         Wt Readings from Last 3 Encounters:  10/06/23 155 lb (70.3 kg)  05/27/23 156 lb (70.8 kg)  05/13/23 154 lb (69.9 kg)   BP Readings from Last 3 Encounters:  10/06/23 118/80  05/27/23 138/82  05/13/23 122/80         Past Medical History:  Diagnosis Date   ADD (attention deficit disorder)    Allergic rhinitis, mild    Essential hypertension 06/04/2015   Hyperlipidemia    IBS (irritable bowel syndrome)    Melanoma (HCC) 1995   Past Surgical History:  Procedure Laterality Date   COLPOSCOPY     WRIST SURGERY  2010    reports that she has never smoked. She has never used smokeless tobacco. She reports current alcohol use. She reports that she does not use drugs. family history includes Cancer in her maternal grandmother, mother, and paternal grandfather; Diabetes in her maternal grandfather and maternal grandmother; Gout in her maternal grandfather; Heart disease in her maternal grandfather, paternal grandfather, and paternal grandmother; Hyperlipidemia in her father; Hypertension in her father, maternal grandmother, and mother; Kidney disease in her maternal grandfather; Macular degeneration in her maternal grandmother; Osteoporosis in her maternal grandmother; Thyroid disease in her mother. Allergies  Allergen Reactions   Prednisone     REACTION: Jittery   Current Outpatient Medications on File Prior to Visit  Medication Sig Dispense Refill   Calcium-Vitamin  D-Vitamin K (CALCIUM SOFT CHEWS PO) Take by mouth. (Patient not taking: Reported on 05/27/2023)     cetirizine (ZYRTEC) 10 MG tablet Take 1 tablet (10 mg total) by mouth daily. (Patient not taking: Reported on 05/27/2023) 30 tablet 11   cholecalciferol (VITAMIN D) 1000 UNITS tablet Take 4,000 Units by mouth daily.     guaiFENesin (MUCINEX) 600 MG 12 hr tablet Take 2 tablets (1,200 mg total) by mouth 2 (two) times daily as needed. (Patient not taking: Reported on 05/27/2023) 60 tablet 1   metoprolol tartrate (LOPRESSOR) 100 MG tablet TAKE ONE TABLET BY MOUTH 2 HOURS PRIOR TO YOUR CARDIAC CT. (Patient not taking: Reported on 05/27/2023) 1 tablet 0   metoprolol tartrate (LOPRESSOR) 25 MG tablet Take 0.5 tablets (12.5 mg total) by mouth daily as needed (for BP>150/90 mmhg.). 30 tablet 0   Multiple Vitamin (MULTIVITAMIN) tablet Take 1 tablet by mouth daily.     Omega-3 Fatty Acids (FISH OIL OMEGA-3 PO) Take 1,400 mg by mouth daily in the afternoon.     rosuvastatin (CRESTOR) 20 MG tablet Take 1 tablet (20 mg total) by mouth daily. 90 tablet 3   triamcinolone (NASACORT) 55 MCG/ACT AERO nasal inhaler Place 2 sprays into the nose daily. (Patient not taking: Reported on 05/27/2023) 1 each 12   No current facility-administered medications on file prior to visit.        ROS:  All others reviewed and negative.  Objective  PE:  BP 118/80 (BP Location: Left Arm, Patient Position: Sitting, Cuff Size: Normal)   Pulse 78   Temp 98.4 F (36.9 C) (Oral)   Ht 5' 9.5" (1.765 m)   Wt 155 lb (70.3 kg)   SpO2 98%   BMI 22.56 kg/m                 Constitutional: Pt appears in NAD               HENT: Head: NCAT.                Right Ear: External ear normal.  Right tm severe erythema, mild effusion               Left Ear: External ear normal.                Eyes: . Pupils are equal, round, and reactive to light. Conjunctivae and EOM are normal               Nose: without d/c or deformity               Neck: Neck  supple. Gross normal ROM               Cardiovascular: Normal rate and regular rhythm.                 Pulmonary/Chest: Effort normal and breath sounds without rales or wheezing.                               Neurological: Pt is alert. At baseline orientation, motor grossly intact               Skin: Skin is warm. No rashes, no other new lesions, LE edema - none               Psychiatric: Pt behavior is normal without agitation   Micro: none  Cardiac tracings I have personally interpreted today:  none  Pertinent Radiological findings (summarize): none   Lab Results  Component Value Date   WBC 8.4 01/28/2023   HGB 14.6 01/28/2023   HCT 42.9 01/28/2023   PLT 208 01/28/2023   GLUCOSE 110 (H) 01/28/2023   CHOL 147 12/23/2022   TRIG 110.0 12/23/2022   HDL 65.40 12/23/2022   LDLDIRECT 80.0 06/27/2018   LDLCALC 59 12/23/2022   ALT 15 12/23/2022   AST 20 12/23/2022   NA 139 01/28/2023   K 3.5 01/28/2023   CL 104 01/28/2023   CREATININE 0.98 01/28/2023   BUN 11 01/28/2023   CO2 22 01/28/2023   TSH 1.986 01/28/2023   POCT - COVID - positive  Assessment/Plan:  Caitlyn Schmidt is a 62 y.o. White or Caucasian [1] female with  has a past medical history of ADD (attention deficit disorder), Allergic rhinitis, mild, Essential hypertension (06/04/2015), Hyperlipidemia, IBS (irritable bowel syndrome), and Melanoma (HCC) (1995).  Right otitis media Mild to mod, for antibx course,  to f/u any worsening symptoms or concerns  COVID-19 virus infection Recent onset, past the window of tx, cont to follow with prn meds  Allergic rhinitis Pt to restart otc nasacort asd,  to f/u any worsening symptoms or concerns  Acute dysfunction of right eustachian tube Ok also for mucinex bid prn  Followup: Return if symptoms worsen or fail to improve.  Oliver Barre, MD 10/08/2023 4:57 PM [facility] -  [facility]

## 2023-10-06 NOTE — Patient Instructions (Addendum)
Your COVID testing today was Positive  I think should still be ok to treat with an antibiotic - Please take all new medication as prescribed- the antibiotic  You can also take Delsym OTC for cough, and/or Mucinex (or it's generic off brand) for congestion, and tylenol as needed for pain.  Please continue all other medications as before, and refills have been done if requested.  Please have the pharmacy call with any other refills you may need.  Please continue your efforts at being more active, low cholesterol diet, and weight control..  Please keep your appointments with your specialists as you may have planned

## 2023-10-08 ENCOUNTER — Encounter: Payer: Self-pay | Admitting: Internal Medicine

## 2023-10-08 NOTE — Assessment & Plan Note (Signed)
Ok also for mucinex bid prn

## 2023-10-08 NOTE — Assessment & Plan Note (Signed)
Pt to restart otc nasacort asd,  to f/u any worsening symptoms or concerns

## 2023-10-08 NOTE — Assessment & Plan Note (Signed)
Mild to mod, for antibx course,  to f/u any worsening symptoms or concerns 

## 2023-10-08 NOTE — Assessment & Plan Note (Signed)
Recent onset, past the window of tx, cont to follow with prn meds

## 2023-11-09 ENCOUNTER — Ambulatory Visit: Payer: BC Managed Care – PPO | Admitting: Nurse Practitioner

## 2023-11-09 VITALS — BP 136/84 | HR 79 | Temp 98.1°F | Ht 69.5 in | Wt 156.1 lb

## 2023-11-09 DIAGNOSIS — H9201 Otalgia, right ear: Secondary | ICD-10-CM

## 2023-11-09 NOTE — Assessment & Plan Note (Signed)
 Chronic Etiology unclear today No evidence of infection today I recommend referral to ENT for further evaluation and to trial second-generation antihistamine, take this at night if drowsiness occurs.  Patient educated on signs and symptoms of infection such as fever, worsening pain, malaise, fatigue, and if this were to occur to reach out for further evaluation.  In the meantime patient to follow-up with ENT as soon as they are able to schedule her.

## 2023-11-09 NOTE — Patient Instructions (Signed)
Zyrtec  

## 2023-11-09 NOTE — Progress Notes (Signed)
 Established Patient Office Visit  Subjective   Patient ID: Caitlyn Schmidt, female    DOB: 05-Oct-1961  Age: 63 y.o. MRN: [id number]  Chief Complaint  Patient presents with   Ear Pain    Right ear pain, was seen and given antibiotics which has not helped. Pain all from the ear to the jaw area      Patient arrives today for approximate one-month history of right ear discomfort that radiates to the jaw. Pain is intermittent can be triggered by pressure or laying on the ear but sometimes pain will occur without any obvious triggers.  She was treated with a Z-Pak about 1 month ago for ear infection.  She also went to her dentist recently because she was having this discomfort and they did panoramic x-rays.  Dentist identified area of the right maxillary sinus which she felt looked abnormal and recommended evaluation with medical provider.  She denies any hearing loss and reports that she has had sinus infections in the past.  This previous July, she was identified to possibly have eustachian tube dysfunction and was recommended to try antihistamine which caused significant sedation so she discontinued this after a dose or 2.  She denies any fever or worsening/new symptoms today.    ROS: see HPI    Objective:     BP 136/84   Pulse 79   Temp 98.1 F (36.7 C) (Temporal)   Ht 5' 9.5" (1.765 m)   Wt 156 lb 2 oz (70.8 kg)   SpO2 99%   BMI 22.73 kg/m    Physical Exam Vitals reviewed.  Constitutional:      General: She is not in acute distress.    Appearance: Normal appearance.  HENT:     Head: Normocephalic and atraumatic.     Comments: No tenderness to frontal of maxillary sinuses on exam    Right Ear: Hearing, tympanic membrane, ear canal and external ear normal.     Left Ear: Hearing, tympanic membrane, ear canal and external ear normal.  Neck:     Vascular: No carotid bruit.  Cardiovascular:     Rate and Rhythm: Normal rate and regular rhythm.     Pulses: Normal pulses.      Heart sounds: Normal heart sounds.  Pulmonary:     Effort: Pulmonary effort is normal.     Breath sounds: Normal breath sounds.  Lymphadenopathy:     Cervical: No cervical adenopathy.  Skin:    General: Skin is warm and dry.  Neurological:     General: No focal deficit present.     Mental Status: She is alert and oriented to person, place, and time.  Psychiatric:        Mood and Affect: Mood normal.        Behavior: Behavior normal.        Judgment: Judgment normal.      No results found for any visits on 11/09/23.    The 10-year ASCVD risk score (Arnett DK, et al., 2019) is: 4%    Assessment & Plan:   Problem List Items Addressed This Visit       Other   Otalgia of right ear - Primary   Chronic Etiology unclear today No evidence of infection today I recommend referral to ENT for further evaluation and to trial second-generation antihistamine, take this at night if drowsiness occurs.  Patient educated on signs and symptoms of infection such as fever, worsening pain, malaise, fatigue, and if this were to  occur to reach out for further evaluation.  In the meantime patient to follow-up with ENT as soon as they are able to schedule her.      Relevant Orders   Ambulatory referral to ENT    Return if symptoms worsen or fail to improve.    Zorita Hiss, NP

## 2023-11-11 ENCOUNTER — Encounter (INDEPENDENT_AMBULATORY_CARE_PROVIDER_SITE_OTHER): Payer: Self-pay | Admitting: Otolaryngology

## 2023-12-07 ENCOUNTER — Telehealth (INDEPENDENT_AMBULATORY_CARE_PROVIDER_SITE_OTHER): Payer: Self-pay | Admitting: Otolaryngology

## 2023-12-07 NOTE — Telephone Encounter (Signed)
REMINDER CALL: Date: 12/08/2023 Status: Sch  Time: 8:30 AM [address] [place], [place] [address]  Phone will ring several times then disconnect

## 2023-12-08 ENCOUNTER — Ambulatory Visit (INDEPENDENT_AMBULATORY_CARE_PROVIDER_SITE_OTHER): Payer: BC Managed Care – PPO | Admitting: Otolaryngology

## 2023-12-08 ENCOUNTER — Encounter (INDEPENDENT_AMBULATORY_CARE_PROVIDER_SITE_OTHER): Payer: Self-pay

## 2023-12-08 VITALS — BP 131/84 | HR 89 | Ht 69.5 in

## 2023-12-08 DIAGNOSIS — H6991 Unspecified Eustachian tube disorder, right ear: Secondary | ICD-10-CM | POA: Diagnosis not present

## 2023-12-08 DIAGNOSIS — H9201 Otalgia, right ear: Secondary | ICD-10-CM

## 2023-12-08 DIAGNOSIS — H6121 Impacted cerumen, right ear: Secondary | ICD-10-CM

## 2023-12-08 MED ORDER — TRIAMCINOLONE ACETONIDE 55 MCG/ACT NA AERO: 2.0000 | INHALATION_SPRAY | Freq: Every day | NASAL | 12 refills | Status: DC

## 2023-12-08 NOTE — Patient Instructions (Signed)
Use nasacort two sprays each nostril twice daily

## 2023-12-08 NOTE — Progress Notes (Signed)
 Dear Dr. Wallace Cullens, Here is my assessment for our mutual patient, Caitlyn Schmidt. Thank you for allowing me the opportunity to care for your patient. Please do not hesitate to contact me should you have any other questions. Sincerely, Dr. Jovita Kussmaul  Otolaryngology Clinic Note Referring provider: Dr. Wallace Cullens HPI:  Caitlyn Schmidt is a 63 y.o. female kindly referred by Dr. Wallace Cullens for evaluation of right ear discomfort and issues.  Patient reports: Caitlyn Schmidt to PCP with right ear pain and discomfort starting in December, went to PCP and was COVID +, given Z-Pak and Nasacort. Persistent issue, then went to PCP again and Dentist, and Dentist noted some max thickening(?) on panoramic. PCP did not think she had ear infection, but referred given dentist findings and right ear issues.  Today reports intermittent stabbing pain (right preauricular, sometimes over temporal area) - quite sharp when it happens. Not related to eating, can happen at any moment. Maybe fullness. No associated events. Couple of times a day, lasts a few seconds and then goes away. Laying down on it makes it worse. She also feels like she has some trismus and ROM in her jaw is limited.  Patient currently denies: ear pain, vertigo, drainage, tinnitus, hearing loss Patient additionally denies: deep pain in ear canal, eustachian tube symptoms such as popping/crackling, sensitivity to pressure changes (unclear, not noticeable now) Patient also denies barotrauma, vestibular suppressant use, ototoxic medication use Prior ear surgery: no Don't get frequent ear infections. Does get sinus infections x1 year (facial pressure, teeth pain, discolored drainage) - gets antibiotics for them. Some spring allergies.  She's tried zyrtec (drowsy). Did not try nasacort consistently  H&N Surgery: no Personal or FHx of bleeding dz or anesthesia difficulty: no  GLP-1: no AP/AC: no  Tobacco: no  PMHx: AR, HLD, ADD, Palpitations  Independent Review of Additional  Tests or Records:  Dr. Jonny Ruiz 10/06/2023 (IM): noted recent covid exposure and URI Sx 1 week with nasal symptoms; right ear popping/crackling and severe pain. Noted effusion right ear. Dx: OM, AR, ETD; Rx: Supportive care, z-pak, mucinex, nasacort Caitlyn Schmidt (11/09/2023) FM: right ear discomfort radiates to jaw, given Z-pak; right max looked abnormal(?) on dentist; noted sinus infections in past; Dx: ETD, Otalgia; Rx: Ref ENT Dr. Fayrene Fearing 05/13/2023: Noted right ear discomfort/pressure/popping, congestion sx; Dx: ETD, AR; Rx: Zyrtec, nasacort [facility] 01/28/2023 independently reviewed and interpreted: paranasal sinuses clear excpet for mild left sphenoid opacification; no significant mastoid or ME opacification; cuts thick but otic capsule and ossicles unremarkable  PMH/Meds/All/SocHx/FamHx/ROS:   Past Medical History:  Diagnosis Date   ADD (attention deficit disorder)    Allergic rhinitis, mild    Essential hypertension 06/04/2015   Hyperlipidemia    IBS (irritable bowel syndrome)    Melanoma (HCC) 1995     Past Surgical History:  Procedure Laterality Date   COLPOSCOPY     WRIST SURGERY  2010    Family History  Problem Relation Age of Onset   Hypertension Mother    Thyroid disease Mother    Cancer Mother        skin   Hypertension Father    Hyperlipidemia Father    Cancer Maternal Grandmother        multiple skin cancer   Diabetes Maternal Grandmother    Hypertension Maternal Grandmother    Osteoporosis Maternal Grandmother    Macular degeneration Maternal Grandmother    Gout Maternal Grandfather    Heart disease Maternal Grandfather    Diabetes Maternal Grandfather    Kidney  disease Maternal Grandfather    Heart disease Paternal Grandmother    Heart disease Paternal Grandfather    Cancer Paternal Grandfather        prostate     Social Connections: Moderately Integrated (10/06/2023)   Social Connection and Isolation Panel [NHANES]    Frequency of Communication with Friends and  Family: More than three times a week    Frequency of Social Gatherings with Friends and Family: More than three times a week    Attends Religious Services: More than 4 times per year    Active Member of [employer] or Organizations: Yes    Attends Banker Meetings: Patient declined    Marital Status: Divorced      Current Outpatient Medications:    Calcium-Vitamin D-Vitamin K (CALCIUM SOFT CHEWS PO), Take by mouth., Disp: , Rfl:    cholecalciferol (VITAMIN D) 1000 UNITS tablet, Take 4,000 Units by mouth daily., Disp: , Rfl:    Multiple Vitamin (MULTIVITAMIN) tablet, Take 1 tablet by mouth daily., Disp: , Rfl:    Omega-3 Fatty Acids (FISH OIL OMEGA-3 PO), Take 1,400 mg by mouth daily in the afternoon., Disp: , Rfl:    rosuvastatin (CRESTOR) 20 MG tablet, Take 1 tablet (20 mg total) by mouth daily., Disp: 90 tablet, Rfl: 3   triamcinolone (NASACORT ALLERGY 24HR) 55 MCG/ACT AERO nasal inhaler, Place 2 sprays into the nose daily., Disp: 10 mL, Rfl: 12   metoprolol tartrate (LOPRESSOR) 25 MG tablet, Take 0.5 tablets (12.5 mg total) by mouth daily as needed (for BP>150/90 mmhg.). (Patient not taking: Reported on 12/08/2023), Disp: 30 tablet, Rfl: 0   Physical Exam:   BP 131/84 (BP Location: Left Arm, Patient Position: Sitting, Cuff Size: Normal)   Pulse 89   Ht 5' 9.5" (1.765 m)   SpO2 94%   BMI 22.73 kg/m   Salient findings:  CN II-XII intact Right cerumen impaction, unable to visualize TM; Left ear; Given history and complaints, ear microscopy was indicated and performed for evaluation with findings as below in physical exam section and in procedures After removal of cerumen, Bilateral EAC clear and TM intact with well pneumatized middle ear spaces Weber 512: mid Rinne 512: AC > BC b/l  Anterior rhinoscopy: Septum dev left, mild dryness and small crust right septum; bilateral inferior turbinates with mild hypertrophy; no purulence No lesions of oral cavity/oropharynx; dentition  good; clearly palpable TMJ crepitus; no oral cavity lesions; able to express saliva both parotids No obviously palpable neck masses/lymphadenopathy/thyromegaly No respiratory distress or stridor  Seprately Identifiable Procedures:  Procedure: Bilateral ear microscopy and cerumen removal using microscope (CPT [id number]) - Mod 25 Pre-procedure diagnosis: Cerumen impaction right external ears Post-procedure diagnosis: same Indication: Right cerumen impaction; given patient's otologic complaints and history as well as for improved and comprehensive examination of external ear and tympanic membrane, bilateral otologic examination using microscope was performed and impacted cerumen removed  Procedure: Patient was placed semi-recumbent. Both ear canals were examined using the microscope with findings above. Impacted cerumen removed on right using suction and currette with improvement in EAC examination and patency. Patient tolerated the procedure well.    Impression & Plans:  Caitlyn Schmidt is a 63 y.o. female with:  1. Right ear impacted cerumen   2. Dysfunction of right eustachian tube   3. Otalgia of right ear    Exam significant for b/l TMJ crepitus and her sx (preauricular pain, temporal discomfort) sound most like TMJ or referred otalgia given reassuring exam. We discussed  other etiologies as well and discussed CT to rule out other etiologies and likely right floor of max mucous retention (since CT Head from 01/2024 otherwise looks quite good). Offered scope as well. She'd like to try TMJ mgmt and empiric rx for ETD and call us if symptoms persistent  - Start nasacort two sprays each nostril as below - Warm compresses x4 weeks, soft foods, NSAIDs - f/u if not improving; can do CT if not improving  See below regarding exact medications prescribed this encounter including dosages and route: Meds ordered this encounter  Medications   triamcinolone (NASACORT ALLERGY 24HR) 55 MCG/ACT AERO nasal  inhaler    Sig: Place 2 sprays into the nose daily.    Dispense:  10 mL    Refill:  12      Thank you for allowing me the opportunity to care for your patient. Please do not hesitate to contact me should you have any other questions.  Sincerely, Jovita Kussmaul, MD Otolaryngologist (ENT), [facility] Phone: [phone] Fax: [phone]  12/08/2023, 9:07 AM   MDM:  Level 4 - [phone] Complexity/Problems addressed: mod Data complexity: mod - independent review and interpretation of notes, labs; independent interpretation of imaging - Morbidity: mod  - Prescription Drug prescribed or managed: yes

## 2023-12-26 ENCOUNTER — Other Ambulatory Visit (INDEPENDENT_AMBULATORY_CARE_PROVIDER_SITE_OTHER)

## 2023-12-26 DIAGNOSIS — E538 Deficiency of other specified B group vitamins: Secondary | ICD-10-CM | POA: Diagnosis not present

## 2023-12-26 DIAGNOSIS — E559 Vitamin D deficiency, unspecified: Secondary | ICD-10-CM | POA: Diagnosis not present

## 2023-12-26 DIAGNOSIS — E785 Hyperlipidemia, unspecified: Secondary | ICD-10-CM

## 2023-12-26 DIAGNOSIS — R739 Hyperglycemia, unspecified: Secondary | ICD-10-CM

## 2023-12-26 LAB — HEPATIC FUNCTION PANEL
ALT: 16 U/L (ref 0–35)
AST: 23 U/L (ref 0–37)
Albumin: 4.3 g/dL (ref 3.5–5.2)
Alkaline Phosphatase: 65 U/L (ref 39–117)
Bilirubin, Direct: 0.1 mg/dL (ref 0.0–0.3)
Total Bilirubin: 0.4 mg/dL (ref 0.2–1.2)
Total Protein: 6.7 g/dL (ref 6.0–8.3)

## 2023-12-26 LAB — LIPID PANEL
Cholesterol: 147 mg/dL (ref 0–200)
HDL: 61.4 mg/dL (ref >39.00)
LDL Cholesterol: 66 mg/dL (ref 0–99)
NonHDL: 86.03
Total CHOL/HDL Ratio: 2
Triglycerides: 100 mg/dL (ref 0.0–149.0)
VLDL: 20 mg/dL (ref 0.0–40.0)

## 2023-12-26 LAB — BASIC METABOLIC PANEL
BUN: 15 mg/dL (ref 6–23)
CO2: 25 meq/L (ref 19–32)
Calcium: 9.2 mg/dL (ref 8.4–10.5)
Chloride: 106 meq/L (ref 96–112)
Creatinine, Ser: 1.04 mg/dL (ref 0.40–1.20)
GFR: 57.93 mL/min — ABNORMAL LOW (ref >60.00)
Glucose, Bld: 93 mg/dL (ref 70–99)
Potassium: 4.2 meq/L (ref 3.5–5.1)
Sodium: 139 meq/L (ref 135–145)

## 2023-12-26 LAB — URINALYSIS, ROUTINE W REFLEX MICROSCOPIC
Bilirubin Urine: NEGATIVE
Hgb urine dipstick: NEGATIVE
Ketones, ur: NEGATIVE
Leukocytes,Ua: NEGATIVE
Nitrite: NEGATIVE
RBC / HPF: NONE SEEN (ref 0–?)
Specific Gravity, Urine: 1.02 (ref 1.000–1.030)
Total Protein, Urine: NEGATIVE
Urine Glucose: NEGATIVE
Urobilinogen, UA: 0.2 (ref 0.0–1.0)
pH: 6 (ref 5.0–8.0)

## 2023-12-26 LAB — CBC WITH DIFFERENTIAL/PLATELET
Basophils Absolute: 0 10*3/uL (ref 0.0–0.1)
Basophils Relative: 0.6 % (ref 0.0–3.0)
Eosinophils Absolute: 0.3 10*3/uL (ref 0.0–0.7)
Eosinophils Relative: 4.1 % (ref 0.0–5.0)
HCT: 39.9 % (ref 36.0–46.0)
Hemoglobin: 13.4 g/dL (ref 12.0–15.0)
Lymphocytes Relative: 26.1 % (ref 12.0–46.0)
Lymphs Abs: 1.6 10*3/uL (ref 0.7–4.0)
MCHC: 33.7 g/dL (ref 30.0–36.0)
MCV: 89.2 fl (ref 78.0–100.0)
Monocytes Absolute: 0.6 10*3/uL (ref 0.1–1.0)
Monocytes Relative: 8.9 % (ref 3.0–12.0)
Neutro Abs: 3.7 10*3/uL (ref 1.4–7.7)
Neutrophils Relative %: 60.3 % (ref 43.0–77.0)
Platelets: 195 10*3/uL (ref 150.0–400.0)
RBC: 4.47 Mil/uL (ref 3.87–5.11)
RDW: 12.5 % (ref 11.5–15.5)
WBC: 6.2 10*3/uL (ref 4.0–10.5)

## 2023-12-26 LAB — VITAMIN B12: Vitamin B-12: 540 pg/mL (ref 211–911)

## 2023-12-26 LAB — HEMOGLOBIN A1C: Hgb A1c MFr Bld: 5.8 % (ref 4.6–6.5)

## 2023-12-26 LAB — TSH: TSH: 3.65 u[IU]/mL (ref 0.35–5.50)

## 2023-12-26 LAB — VITAMIN D 25 HYDROXY (VIT D DEFICIENCY, FRACTURES): VITD: 60.8 ng/mL (ref 30.00–100.00)

## 2023-12-30 ENCOUNTER — Ambulatory Visit: Payer: BC Managed Care – PPO | Admitting: Internal Medicine

## 2023-12-30 ENCOUNTER — Encounter: Payer: Self-pay | Admitting: Internal Medicine

## 2023-12-30 VITALS — BP 122/80 | HR 60 | Temp 97.9°F | Ht 69.5 in | Wt 154.0 lb

## 2023-12-30 DIAGNOSIS — N1831 Chronic kidney disease, stage 3a: Secondary | ICD-10-CM

## 2023-12-30 DIAGNOSIS — R42 Dizziness and giddiness: Secondary | ICD-10-CM | POA: Diagnosis not present

## 2023-12-30 DIAGNOSIS — Z0000 Encounter for general adult medical examination without abnormal findings: Secondary | ICD-10-CM

## 2023-12-30 DIAGNOSIS — E785 Hyperlipidemia, unspecified: Secondary | ICD-10-CM | POA: Diagnosis not present

## 2023-12-30 DIAGNOSIS — R030 Elevated blood-pressure reading, without diagnosis of hypertension: Secondary | ICD-10-CM | POA: Diagnosis not present

## 2023-12-30 DIAGNOSIS — Z0001 Encounter for general adult medical examination with abnormal findings: Secondary | ICD-10-CM

## 2023-12-30 MED ORDER — METOPROLOL TARTRATE 25 MG PO TABS: 12.5000 mg | ORAL_TABLET | Freq: Every day | ORAL | 11 refills | Status: AC | PRN

## 2023-12-30 NOTE — Assessment & Plan Note (Signed)
 Lab Results  Component Value Date   CREATININE 1.04 12/26/2023   Stable overall, cont to avoid nephrotoxins

## 2023-12-30 NOTE — Progress Notes (Signed)
 Patient ID: Caitlyn Schmidt, female   DOB: [birth year], 63 y.o.   MRN: [id number]         Chief Complaint:: wellness exam and elevated BP, hld, dizziness, ckd 3a       HPI:  Caitlyn Schmidt is a 63 y.o. female here for wellness exam; due for GYN on mar 18; o/w up to date                        Also BP at home has been mildly elevated recently, did have episode dizziness with standing at church where she had to go the ladies room to let it pass, none further and Pt denies chest pain, increased sob or doe, wheezing, orthopnea, PND, increased LE swelling, palpitations, or syncope.   Pt denies polydipsia, polyuria, or new focal neuro s/s.    Pt denies fever, wt loss, night sweats, loss of appetite, or other constitutional symptoms  Pt concerned as father had afib and stroke.  BP at home with wrist cuff have been mildly high - not clear how accurate this is,, HR in 60-80's. Has not taken the low dose BB on her med list that she takes prn elevated BP.     Wt Readings from Last 3 Encounters:  12/30/23 154 lb (69.9 kg)  11/09/23 156 lb 2 oz (70.8 kg)  10/06/23 155 lb (70.3 kg)   BP Readings from Last 3 Encounters:  12/30/23 122/80  12/08/23 131/84  11/09/23 136/84   Immunization History  Administered Date(s) Administered   Influenza,inj,Quad PF,6+ Mos 07/01/2017, 07/20/2019, 08/16/2020   PFIZER(Purple Top)SARS-COV-2 Vaccination 11/05/2019, 11/26/2019, 09/20/2020   Td 10/25/1998   Tdap 06/04/2015   Zoster Recombinant(Shingrix) 02/22/2018, 09/07/2018   Health Maintenance Due  Topic Date Due   Cervical Cancer Screening (HPV/Pap Cotest)  Never done      Past Medical History:  Diagnosis Date   ADD (attention deficit disorder)    Allergic rhinitis, mild    Essential hypertension 06/04/2015   Hyperlipidemia    IBS (irritable bowel syndrome)    Melanoma (HCC) 1995   Past Surgical History:  Procedure Laterality Date   COLPOSCOPY     WRIST SURGERY  2010    reports that she has never smoked.  She has never used smokeless tobacco. She reports current alcohol use. She reports that she does not use drugs. family history includes Cancer in her maternal grandmother, mother, and paternal grandfather; Diabetes in her maternal grandfather and maternal grandmother; Gout in her maternal grandfather; Heart disease in her maternal grandfather, paternal grandfather, and paternal grandmother; Hyperlipidemia in her father; Hypertension in her father, maternal grandmother, and mother; Kidney disease in her maternal grandfather; Macular degeneration in her maternal grandmother; Osteoporosis in her maternal grandmother; Thyroid disease in her mother. Allergies  Allergen Reactions   Prednisone     REACTION: Jittery   Current Outpatient Medications on File Prior to Visit  Medication Sig Dispense Refill   Calcium-Vitamin D-Vitamin K (CALCIUM SOFT CHEWS PO) Take by mouth.     cholecalciferol (VITAMIN D) 1000 UNITS tablet Take 4,000 Units by mouth daily.     Multiple Vitamin (MULTIVITAMIN) tablet Take 1 tablet by mouth daily.     Omega-3 Fatty Acids (FISH OIL OMEGA-3 PO) Take 1,400 mg by mouth daily in the afternoon.     rosuvastatin (CRESTOR) 20 MG tablet Take 1 tablet (20 mg total) by mouth daily. 90 tablet 3   triamcinolone (NASACORT ALLERGY 24HR) 55 MCG/ACT AERO  nasal inhaler Place 2 sprays into the nose daily. 10 mL 12   No current facility-administered medications on file prior to visit.        ROS:  All others reviewed and negative.  Objective        PE:  BP 122/80 (BP Location: Left Arm, Patient Position: Sitting, Cuff Size: Normal)   Pulse 60   Temp 97.9 F (36.6 C) (Oral)   Ht 5' 9.5" (1.765 m)   Wt 154 lb (69.9 kg)   SpO2 99%   BMI 22.42 kg/m                 Constitutional: Pt appears in NAD               HENT: Head: NCAT.                Right Ear: External ear normal.                 Left Ear: External ear normal.                Eyes: . Pupils are equal, round, and reactive to  light. Conjunctivae and EOM are normal               Nose: without d/c or deformity               Neck: Neck supple. Gross normal ROM               Cardiovascular: Normal rate and regular rhythm.                 Pulmonary/Chest: Effort normal and breath sounds without rales or wheezing.                Abd:  Soft, NT, ND, + BS, no organomegaly               Neurological: Pt is alert. At baseline orientation, motor grossly intact               Skin: Skin is warm. No rashes, no other new lesions, LE edema - none               Psychiatric: Pt behavior is normal without agitation   Micro: none  Cardiac tracings I have personally interpreted today:  none  Pertinent Radiological findings (summarize): none   Lab Results  Component Value Date   WBC 6.2 12/26/2023   HGB 13.4 12/26/2023   HCT 39.9 12/26/2023   PLT 195.0 12/26/2023   GLUCOSE 93 12/26/2023   CHOL 147 12/26/2023   TRIG 100.0 12/26/2023   HDL 61.40 12/26/2023   LDLDIRECT 80.0 06/27/2018   LDLCALC 66 12/26/2023   ALT 16 12/26/2023   AST 23 12/26/2023   NA 139 12/26/2023   K 4.2 12/26/2023   CL 106 12/26/2023   CREATININE 1.04 12/26/2023   BUN 15 12/26/2023   CO2 25 12/26/2023   TSH 3.65 12/26/2023   HGBA1C 5.8 12/26/2023   Assessment/Plan:  Caitlyn Schmidt is a 63 y.o. White or Caucasian [1] female with  has a past medical history of ADD (attention deficit disorder), Allergic rhinitis, mild, Essential hypertension (06/04/2015), Hyperlipidemia, IBS (irritable bowel syndrome), and Melanoma (HCC) (1995).  Encounter for well adult exam with abnormal findings Age and sex appropriate education and counseling updated with regular exercise and diet Referrals for preventative services - has Gyn appt for mar 18 Immunizations addressed - none needed Smoking counseling  -  none needed Evidence for depression or other mood disorder - none significant Most recent labs reviewed. I have personally reviewed and have noted: 1) the  patient's medical and social history 2) The patient's current medications and supplements 3) The patient's height, weight, and BMI have been recorded in the chart   Hyperlipidemia Lab Results  Component Value Date   LDLCALC 66 12/26/2023   Stable, pt to continue current statin crestor 20 mg qd   Dizziness Etiology unclear, current labs benign, had extensive CV eval last yr, declines repeat ecg or other today  CKD stage 3a, GFR 45-59 ml/min (HCC) Lab Results  Component Value Date   CREATININE 1.04 12/26/2023   Stable overall, cont to avoid nephrotoxins   Elevated BP without diagnosis of hypertension BP repeat here 138/82, and her wrist cuff is 152/100.  Suspect she should change her BP cuff, and continue to monitor, no further scheduled tx at this time  , but consider losartan if she conts to have elevated BP at home with new cuff  Followup: No follow-ups on file.  Oliver Barre, MD 12/30/2023 10:01 AM [facility]

## 2023-12-30 NOTE — Assessment & Plan Note (Signed)
 Etiology unclear, current labs benign, had extensive CV eval last yr, declines repeat ecg or other today

## 2023-12-30 NOTE — Assessment & Plan Note (Signed)
 Lab Results  Component Value Date   LDLCALC 66 12/26/2023   Stable, pt to continue current statin crestor 20 mg qd

## 2023-12-30 NOTE — Assessment & Plan Note (Signed)
 Age and sex appropriate education and counseling updated with regular exercise and diet Referrals for preventative services - has Gyn appt for mar 18 Immunizations addressed - none needed Smoking counseling  - none needed Evidence for depression or other mood disorder - none significant Most recent labs reviewed. I have personally reviewed and have noted: 1) the patient's medical and social history 2) The patient's current medications and supplements 3) The patient's height, weight, and BMI have been recorded in the chart

## 2023-12-30 NOTE — Assessment & Plan Note (Signed)
 BP repeat here 138/82, and her wrist cuff is 152/100.  Suspect she should change her BP cuff, and continue to monitor, no further scheduled tx at this time  , but consider losartan if she conts to have elevated BP at home with new cuff

## 2024-01-10 DIAGNOSIS — Z01419 Encounter for gynecological examination (general) (routine) without abnormal findings: Secondary | ICD-10-CM | POA: Diagnosis not present

## 2024-01-10 DIAGNOSIS — Z1231 Encounter for screening mammogram for malignant neoplasm of breast: Secondary | ICD-10-CM | POA: Diagnosis not present

## 2024-01-10 DIAGNOSIS — Z1331 Encounter for screening for depression: Secondary | ICD-10-CM | POA: Diagnosis not present

## 2024-01-10 LAB — HM MAMMOGRAPHY

## 2024-02-01 DIAGNOSIS — C44629 Squamous cell carcinoma of skin of left upper limb, including shoulder: Secondary | ICD-10-CM | POA: Diagnosis not present

## 2024-02-01 DIAGNOSIS — D492 Neoplasm of unspecified behavior of bone, soft tissue, and skin: Secondary | ICD-10-CM | POA: Diagnosis not present

## 2024-02-02 DIAGNOSIS — I10 Essential (primary) hypertension: Secondary | ICD-10-CM | POA: Diagnosis not present

## 2024-02-02 DIAGNOSIS — E782 Mixed hyperlipidemia: Secondary | ICD-10-CM | POA: Diagnosis not present

## 2024-02-02 DIAGNOSIS — Z1211 Encounter for screening for malignant neoplasm of colon: Secondary | ICD-10-CM | POA: Diagnosis not present

## 2024-02-27 DIAGNOSIS — C44629 Squamous cell carcinoma of skin of left upper limb, including shoulder: Secondary | ICD-10-CM | POA: Diagnosis not present

## 2024-03-06 ENCOUNTER — Other Ambulatory Visit: Payer: Self-pay

## 2024-03-06 ENCOUNTER — Other Ambulatory Visit: Payer: Self-pay | Admitting: Internal Medicine

## 2024-06-15 DIAGNOSIS — K648 Other hemorrhoids: Secondary | ICD-10-CM | POA: Diagnosis not present

## 2024-06-15 DIAGNOSIS — Z1211 Encounter for screening for malignant neoplasm of colon: Secondary | ICD-10-CM | POA: Diagnosis not present

## 2024-06-15 DIAGNOSIS — K5730 Diverticulosis of large intestine without perforation or abscess without bleeding: Secondary | ICD-10-CM | POA: Diagnosis not present

## 2024-06-15 LAB — HM COLONOSCOPY

## 2024-08-20 ENCOUNTER — Emergency Department (HOSPITAL_COMMUNITY)

## 2024-08-20 ENCOUNTER — Observation Stay (HOSPITAL_COMMUNITY)
Admission: EM | Admit: 2024-08-20 | Discharge: 2024-08-21 | Disposition: A | Attending: Emergency Medicine | Admitting: Emergency Medicine

## 2024-08-20 ENCOUNTER — Ambulatory Visit: Payer: Self-pay

## 2024-08-20 ENCOUNTER — Encounter (HOSPITAL_COMMUNITY): Payer: Self-pay | Admitting: Emergency Medicine

## 2024-08-20 ENCOUNTER — Other Ambulatory Visit: Payer: Self-pay

## 2024-08-20 DIAGNOSIS — F10920 Alcohol use, unspecified with intoxication, uncomplicated: Secondary | ICD-10-CM | POA: Insufficient documentation

## 2024-08-20 DIAGNOSIS — N1831 Chronic kidney disease, stage 3a: Secondary | ICD-10-CM | POA: Insufficient documentation

## 2024-08-20 DIAGNOSIS — I129 Hypertensive chronic kidney disease with stage 1 through stage 4 chronic kidney disease, or unspecified chronic kidney disease: Secondary | ICD-10-CM | POA: Diagnosis not present

## 2024-08-20 DIAGNOSIS — E785 Hyperlipidemia, unspecified: Secondary | ICD-10-CM | POA: Diagnosis not present

## 2024-08-20 DIAGNOSIS — J90 Pleural effusion, not elsewhere classified: Secondary | ICD-10-CM | POA: Diagnosis not present

## 2024-08-20 DIAGNOSIS — I10 Essential (primary) hypertension: Secondary | ICD-10-CM | POA: Diagnosis not present

## 2024-08-20 DIAGNOSIS — R21 Rash and other nonspecific skin eruption: Secondary | ICD-10-CM | POA: Insufficient documentation

## 2024-08-20 DIAGNOSIS — I2694 Multiple subsegmental pulmonary emboli without acute cor pulmonale: Secondary | ICD-10-CM | POA: Diagnosis not present

## 2024-08-20 DIAGNOSIS — I2699 Other pulmonary embolism without acute cor pulmonale: Principal | ICD-10-CM | POA: Diagnosis present

## 2024-08-20 DIAGNOSIS — R0981 Nasal congestion: Secondary | ICD-10-CM | POA: Insufficient documentation

## 2024-08-20 DIAGNOSIS — R10A1 Flank pain, right side: Secondary | ICD-10-CM | POA: Diagnosis not present

## 2024-08-20 DIAGNOSIS — R109 Unspecified abdominal pain: Secondary | ICD-10-CM | POA: Diagnosis not present

## 2024-08-20 LAB — COMPREHENSIVE METABOLIC PANEL WITH GFR
ALT: 14 U/L (ref 0–44)
AST: 21 U/L (ref 15–41)
Albumin: 3.7 g/dL (ref 3.5–5.0)
Alkaline Phosphatase: 67 U/L (ref 38–126)
Anion gap: 10 (ref 5–15)
BUN: 13 mg/dL (ref 8–23)
CO2: 23 mmol/L (ref 22–32)
Calcium: 8.7 mg/dL — ABNORMAL LOW (ref 8.9–10.3)
Chloride: 100 mmol/L (ref 98–111)
Creatinine, Ser: 1.06 mg/dL — ABNORMAL HIGH (ref 0.44–1.00)
GFR, Estimated: 59 mL/min — ABNORMAL LOW (ref >60)
Glucose, Bld: 122 mg/dL — ABNORMAL HIGH (ref 70–99)
Potassium: 3.7 mmol/L (ref 3.5–5.1)
Sodium: 133 mmol/L — ABNORMAL LOW (ref 135–145)
Total Bilirubin: 0.8 mg/dL (ref 0.0–1.2)
Total Protein: 7.1 g/dL (ref 6.5–8.1)

## 2024-08-20 LAB — CBC WITH DIFFERENTIAL/PLATELET
Abs Immature Granulocytes: 0.05 K/uL (ref 0.00–0.07)
Basophils Absolute: 0.1 K/uL (ref 0.0–0.1)
Basophils Relative: 0 %
Eosinophils Absolute: 0.1 K/uL (ref 0.0–0.5)
Eosinophils Relative: 0 %
HCT: 40.9 % (ref 36.0–46.0)
Hemoglobin: 13.4 g/dL (ref 12.0–15.0)
Immature Granulocytes: 0 %
Lymphocytes Relative: 8 %
Lymphs Abs: 1.1 K/uL (ref 0.7–4.0)
MCH: 29.3 pg (ref 26.0–34.0)
MCHC: 32.8 g/dL (ref 30.0–36.0)
MCV: 89.3 fL (ref 80.0–100.0)
Monocytes Absolute: 1.4 K/uL — ABNORMAL HIGH (ref 0.1–1.0)
Monocytes Relative: 10 %
Neutro Abs: 11.1 K/uL — ABNORMAL HIGH (ref 1.7–7.7)
Neutrophils Relative %: 82 %
Platelets: 214 K/uL (ref 150–400)
RBC: 4.58 MIL/uL (ref 3.87–5.11)
RDW: 12.1 % (ref 11.5–15.5)
WBC: 13.7 K/uL — ABNORMAL HIGH (ref 4.0–10.5)
nRBC: 0 % (ref 0.0–0.2)

## 2024-08-20 LAB — URINALYSIS, ROUTINE W REFLEX MICROSCOPIC
Bilirubin Urine: NEGATIVE
Glucose, UA: NEGATIVE mg/dL
Hgb urine dipstick: NEGATIVE
Ketones, ur: 5 mg/dL — AB
Nitrite: NEGATIVE
Protein, ur: NEGATIVE mg/dL
Specific Gravity, Urine: 1.023 (ref 1.005–1.030)
pH: 5 (ref 5.0–8.0)

## 2024-08-20 LAB — LIPASE, BLOOD: Lipase: 34 U/L (ref 11–51)

## 2024-08-20 MED ORDER — DIPHENHYDRAMINE HCL 25 MG PO CAPS
50.0000 mg | ORAL_CAPSULE | Freq: Once | ORAL | Status: AC
  Administered 2024-08-20: 50 mg via ORAL
  Filled 2024-08-20: qty 2

## 2024-08-20 MED ORDER — PREDNISONE 20 MG PO TABS
60.0000 mg | ORAL_TABLET | Freq: Once | ORAL | Status: AC
  Administered 2024-08-20: 60 mg via ORAL
  Filled 2024-08-20: qty 3

## 2024-08-20 NOTE — Progress Notes (Deleted)
   Acute Office Visit  Subjective:     Patient ID: Caitlyn Schmidt, female    DOB: [birth year], 62 y.o.   MRN: [id number]  No chief complaint on file.   HPI  Discussed the use of AI scribe software for clinical note transcription with the patient, who gave verbal consent to proceed.  History of Present Illness      ROS Per HPI      Objective:    There were no vitals taken for this visit.   Physical Exam Vitals and nursing note reviewed.  Constitutional:      General: She is not in acute distress.    Appearance: Normal appearance. She is normal weight.  HENT:     Head: Normocephalic and atraumatic.     Right Ear: External ear normal.     Left Ear: External ear normal.     Nose: Nose normal.     Mouth/Throat:     Mouth: Mucous membranes are moist.     Pharynx: Oropharynx is clear.  Eyes:     Extraocular Movements: Extraocular movements intact.     Pupils: Pupils are equal, round, and reactive to light.  Cardiovascular:     Rate and Rhythm: Normal rate and regular rhythm.     Pulses: Normal pulses.     Heart sounds: Normal heart sounds.  Pulmonary:     Effort: Pulmonary effort is normal. No respiratory distress.     Breath sounds: Normal breath sounds. No wheezing, rhonchi or rales.  Musculoskeletal:        General: Normal range of motion.     Cervical back: Normal range of motion.     Right lower leg: No edema.     Left lower leg: No edema.  Lymphadenopathy:     Cervical: No cervical adenopathy.  Neurological:     General: No focal deficit present.     Mental Status: She is alert and oriented to person, place, and time.  Psychiatric:        Mood and Affect: Mood normal.        Thought Content: Thought content normal.     No results found for any visits on 08/21/24.      Assessment & Plan:   Assessment and Plan Assessment & Plan      No orders of the defined types were placed in this encounter.    No orders of the defined types were  placed in this encounter.   No follow-ups on file.  Corean LITTIE Ku, FNP

## 2024-08-20 NOTE — Telephone Encounter (Signed)
 FYI Only or Action Required?: FYI only for provider.  Patient was last seen in primary care on 12/30/2023 by Norleen Lynwood ORN, MD.  Called Nurse Triage reporting Flank Pain.  Symptoms began yesterday.  Interventions attempted: Nothing.  Symptoms are: unchanged.  Triage Disposition: See Physician Within 24 Hours  Patient/caregiver understands and will follow disposition?: Yes     Copied from CRM #[id number]. Topic: Clinical - Red Word Triage >> Aug 20, 2024 10:16 AM Victoria A wrote: [facility] that prompted transfer to Nurse Triage: Patient is having pain in back and side of torso. Pain causes pt to loose her breath. Pain started last night.       Reason for Disposition  MODERATE pain (e.g., interferes with normal activities or awakens from sleep)  Answer Assessment - Initial Assessment Questions 1. LOCATION: Where does it hurt? (e.g., left, right)     Right sided  2. ONSET: When did the pain start?     Yesterday  3. SEVERITY: How bad is the pain? (e.g., Scale 1-10; mild, moderate, or severe)     Moderate  4. PATTERN: Does the pain come and go, or is it constant?      Constant  5. CAUSE: What do you think is causing the pain?     Unsure  6. OTHER SYMPTOMS:  Do you have any other symptoms? (e.g., fever, abdomen pain, vomiting, leg weakness, burning with urination, blood in urine)     No  Protocols used: Flank Pain-A-AH

## 2024-08-20 NOTE — ED Triage Notes (Signed)
 Patient reports right flank pain this evening unrelieved by OTC Tylenol  , denies injury or urinary discomfort .

## 2024-08-20 NOTE — ED Triage Notes (Signed)
 Patient c/o right flank pain that started last night.  Patient denies history of kidney stones, denies urinary symptoms.

## 2024-08-20 NOTE — ED Provider Triage Note (Addendum)
 Emergency Medicine Provider Triage Evaluation Note  Caitlyn Schmidt , a 63 y.o. female  was evaluated in triage.  Pt complains of flank pain. R flank pain since yesterday, pain worse with movement.  No fever, chills, lighthead, dizzy, cp, sob, dysuria, hematuria, or rash.  No heavy lifting.  Heating pad and tylenol  hasn't help.  No hx of kidney stone or shingle  Review of Systems  Positive: As above Negative: As above  Physical Exam  BP (!) 161/66   Pulse 87   Temp 98.3 F (36.8 C)   Resp 16   Wt 69.9 kg   SpO2 97%   BMI 22.42 kg/m  Gen:   Awake, no distress   Resp:  Normal effort  MSK:   Moves extremities without difficulty  Other:    Medical Decision Making  Medically screening exam initiated at 9:27 PM.  Appropriate orders placed.  Caitlyn Schmidt was informed that the remainder of the evaluation will be completed by another provider, this initial triage assessment does not replace that evaluation, and the importance of remaining in the ED until their evaluation is complete.     Nivia Colon, PA-C 08/20/24 2128   ADDENDUM:  9:55 PM Pt noticed a rash which appears on her R arm.  It is mildly itchy.  Denies mouth sores or sob.  She did recall having a rash to the back of her neck recently and thought it may be related to her shampoo.    Rash exam, and does have urticaria appearance.  Throat exam and lungs exam unremarkable.  Will give prednisone and benadryl.  Will monitor   Nivia Colon, PA-C 08/20/24 2157

## 2024-08-21 ENCOUNTER — Other Ambulatory Visit (HOSPITAL_COMMUNITY): Payer: Self-pay

## 2024-08-21 ENCOUNTER — Emergency Department (HOSPITAL_COMMUNITY)

## 2024-08-21 ENCOUNTER — Ambulatory Visit: Admitting: Family Medicine

## 2024-08-21 ENCOUNTER — Telehealth (HOSPITAL_COMMUNITY): Payer: Self-pay | Admitting: Pharmacy Technician

## 2024-08-21 ENCOUNTER — Observation Stay (HOSPITAL_COMMUNITY)

## 2024-08-21 DIAGNOSIS — J90 Pleural effusion, not elsewhere classified: Secondary | ICD-10-CM | POA: Diagnosis not present

## 2024-08-21 DIAGNOSIS — I2693 Single subsegmental pulmonary embolism without acute cor pulmonale: Secondary | ICD-10-CM

## 2024-08-21 DIAGNOSIS — R10A1 Flank pain, right side: Secondary | ICD-10-CM | POA: Diagnosis not present

## 2024-08-21 DIAGNOSIS — I2699 Other pulmonary embolism without acute cor pulmonale: Secondary | ICD-10-CM | POA: Diagnosis not present

## 2024-08-21 DIAGNOSIS — R918 Other nonspecific abnormal finding of lung field: Secondary | ICD-10-CM | POA: Diagnosis not present

## 2024-08-21 LAB — BASIC METABOLIC PANEL WITH GFR
Anion gap: 9 (ref 5–15)
BUN: 12 mg/dL (ref 8–23)
CO2: 23 mmol/L (ref 22–32)
Calcium: 9.1 mg/dL (ref 8.9–10.3)
Chloride: 104 mmol/L (ref 98–111)
Creatinine, Ser: 0.96 mg/dL (ref 0.44–1.00)
GFR, Estimated: >60 mL/min (ref >60)
Glucose, Bld: 190 mg/dL — ABNORMAL HIGH (ref 70–99)
Potassium: 3.9 mmol/L (ref 3.5–5.1)
Sodium: 136 mmol/L (ref 135–145)

## 2024-08-21 LAB — CBC
HCT: 38.9 % (ref 36.0–46.0)
Hemoglobin: 12.9 g/dL (ref 12.0–15.0)
MCH: 29.8 pg (ref 26.0–34.0)
MCHC: 33.2 g/dL (ref 30.0–36.0)
MCV: 89.8 fL (ref 80.0–100.0)
Platelets: 183 K/uL (ref 150–400)
RBC: 4.33 MIL/uL (ref 3.87–5.11)
RDW: 11.9 % (ref 11.5–15.5)
WBC: 11.2 K/uL — ABNORMAL HIGH (ref 4.0–10.5)
nRBC: 0 % (ref 0.0–0.2)

## 2024-08-21 LAB — D-DIMER, QUANTITATIVE: D-Dimer, Quant: 1.58 ug{FEU}/mL — ABNORMAL HIGH (ref 0.00–0.50)

## 2024-08-21 LAB — ECHOCARDIOGRAM COMPLETE
Area-P 1/2: 3.31 cm2
Height: 69 in
S' Lateral: 2.5 cm
Weight: 2522.06 [oz_av]

## 2024-08-21 LAB — TROPONIN I (HIGH SENSITIVITY)
Troponin I (High Sensitivity): 3 ng/L (ref <18)
Troponin I (High Sensitivity): 3 ng/L (ref <18)

## 2024-08-21 LAB — BRAIN NATRIURETIC PEPTIDE: B Natriuretic Peptide: 25.8 pg/mL (ref 0.0–100.0)

## 2024-08-21 LAB — PHOSPHORUS: Phosphorus: 3 mg/dL (ref 2.5–4.6)

## 2024-08-21 LAB — HIV ANTIBODY (ROUTINE TESTING W REFLEX): HIV Screen 4th Generation wRfx: NONREACTIVE

## 2024-08-21 LAB — RESP PANEL BY RT-PCR (RSV, FLU A&B, COVID)  RVPGX2
Influenza A by PCR: NEGATIVE
Influenza B by PCR: NEGATIVE
Resp Syncytial Virus by PCR: NEGATIVE
SARS Coronavirus 2 by RT PCR: NEGATIVE

## 2024-08-21 LAB — MAGNESIUM: Magnesium: 2 mg/dL (ref 1.7–2.4)

## 2024-08-21 MED ORDER — POLYETHYLENE GLYCOL 3350 17 G PO PACK: 17.0000 g | PACK | Freq: Every day | ORAL | Status: DC | PRN

## 2024-08-21 MED ORDER — MELATONIN 3 MG PO TABS: 6.0000 mg | ORAL_TABLET | Freq: Every evening | ORAL | Status: DC | PRN

## 2024-08-21 MED ORDER — APIXABAN (ELIQUIS) VTE STARTER PACK (10MG AND 5MG)
ORAL_TABLET | ORAL | 0 refills | Status: DC
  Filled 2024-08-21: qty 74, 30d supply, fill #0

## 2024-08-21 MED ORDER — APIXABAN 5 MG PO TABS
10.0000 mg | ORAL_TABLET | Freq: Two times a day (BID) | ORAL | Status: DC
  Administered 2024-08-21 (×2): 10 mg via ORAL
  Filled 2024-08-21 (×2): qty 2

## 2024-08-21 MED ORDER — LIDOCAINE 5 % EX PTCH
1.0000 | MEDICATED_PATCH | CUTANEOUS | Status: DC
  Filled 2024-08-21: qty 1

## 2024-08-21 MED ORDER — ACETAMINOPHEN 500 MG PO TABS
1000.0000 mg | ORAL_TABLET | Freq: Four times a day (QID) | ORAL | 0 refills | Status: AC | PRN
  Filled 2024-08-21: qty 30, 4d supply, fill #0

## 2024-08-21 MED ORDER — SODIUM CHLORIDE 0.9% FLUSH
3.0000 mL | Freq: Two times a day (BID) | INTRAVENOUS | Status: DC
  Administered 2024-08-21: 3 mL via INTRAVENOUS

## 2024-08-21 MED ORDER — BENZONATATE 100 MG PO CAPS
100.0000 mg | ORAL_CAPSULE | Freq: Two times a day (BID) | ORAL | 0 refills | Status: AC
  Filled 2024-08-21: qty 14, 7d supply, fill #0

## 2024-08-21 MED ORDER — ACETAMINOPHEN 500 MG PO TABS: 1000.0000 mg | ORAL_TABLET | Freq: Four times a day (QID) | ORAL | Status: DC | PRN

## 2024-08-21 MED ORDER — ALBUTEROL SULFATE (2.5 MG/3ML) 0.083% IN NEBU: 2.5000 mg | INHALATION_SOLUTION | RESPIRATORY_TRACT | Status: DC | PRN

## 2024-08-21 MED ORDER — LIDOCAINE 5 % EX PTCH
1.0000 | MEDICATED_PATCH | CUTANEOUS | 0 refills | Status: AC
  Filled 2024-08-21: qty 7, 7d supply, fill #0

## 2024-08-21 MED ORDER — ALBUTEROL SULFATE (2.5 MG/3ML) 0.083% IN NEBU
2.5000 mg | INHALATION_SOLUTION | RESPIRATORY_TRACT | 12 refills | Status: AC | PRN
  Filled 2024-08-21: qty 90, 5d supply, fill #0

## 2024-08-21 MED ORDER — IOHEXOL 350 MG/ML SOLN
75.0000 mL | Freq: Once | INTRAVENOUS | Status: AC | PRN
  Administered 2024-08-21: 75 mL via INTRAVENOUS

## 2024-08-21 MED ORDER — APIXABAN 5 MG PO TABS: 5.0000 mg | ORAL_TABLET | Freq: Two times a day (BID) | ORAL | Status: DC

## 2024-08-21 MED ORDER — ONDANSETRON HCL 4 MG/2ML IJ SOLN: 4.0000 mg | Freq: Four times a day (QID) | INTRAMUSCULAR | Status: DC | PRN

## 2024-08-21 NOTE — ED Provider Notes (Signed)
 [place] [facility] DEPARTMENT AT [facility] Provider Note   CSN: [id number] Arrival date & time: 08/20/24  2105     Patient presents with: Right Flank Pain    MIRKA BARBONE is a 63 y.o. female.   Patient with history of melanoma, hypertension, hyperlipidemia presents with right sided flank pain.  Symptoms onset 24 hours ago progressively worsening not improved with Tylenol  and heating pad at home.  Denies fall or trauma.  No history of similar.  No radiation of the pain down her leg.  No focal weakness, numbness or tingling.  No bowel or bladder incontinence.  Has felt hot and cold with some sweats but not checked her temperature.  Denies pain with urination or blood in the urine.  No history of kidney stones.  No rash in this area.  Did have rash on her arm in triage which was resolved after prednisone. Denies any bowel or bladder incontinence.  No focal weakness, numbness or tingling.  No weakness in her legs.  No abdominal pain or chest pain.  No cough or fever.  Does not feel short of breath.  Pain is somewhat pleuritic.  The history is provided by the patient and the spouse.       Prior to Admission medications   Medication Sig Start Date End Date Taking? Authorizing Provider  Calcium -Vitamin D -Vitamin K (CALCIUM  SOFT CHEWS PO) Take by mouth.    [provider]  cholecalciferol (VITAMIN D ) 1000 UNITS tablet Take 4,000 Units by mouth daily.    [provider]  metoprolol  tartrate (LOPRESSOR ) 25 MG tablet Take 0.5 tablets (12.5 mg total) by mouth daily as needed (for BP>150/90 mmhg.). 12/30/23 12/29/24  Norleen Lynwood ORN, MD  Multiple Vitamin (MULTIVITAMIN) tablet Take 1 tablet by mouth daily.    [provider]  Omega-3 Fatty Acids (FISH OIL OMEGA-3 PO) Take 1,400 mg by mouth daily in the afternoon.    [provider]  rosuvastatin  (CRESTOR ) 20 MG tablet TAKE 1 TABLET BY MOUTH EVERY DAY 03/06/24   Norleen Lynwood ORN, MD  triamcinolone  (NASACORT   ALLERGY 24HR) 55 MCG/ACT AERO nasal inhaler Place 2 sprays into the nose daily. 12/08/23 01/19/24  Tobie Eldora KATHEE, MD    Allergies: Prednisone    Review of Systems  Constitutional:  Negative for activity change, appetite change and fever.  HENT:  Negative for congestion and rhinorrhea.   Respiratory:  Negative for cough, chest tightness and shortness of breath.   Cardiovascular:  Negative for chest pain.  Gastrointestinal:  Negative for abdominal pain, nausea and vomiting.  Genitourinary:  Negative for dysuria and hematuria.  Musculoskeletal:  Positive for arthralgias, back pain and myalgias.  Skin:  Negative for rash.  Neurological:  Negative for dizziness, weakness and headaches.   all other systems are negative except as noted in the HPI and PMH.    Updated Vital Signs BP (!) 161/66   Pulse 87   Temp 98.3 F (36.8 C)   Resp 16   Wt 69.9 kg   SpO2 97%   BMI 22.42 kg/m   Physical Exam Vitals and nursing note reviewed.  Constitutional:      General: She is not in acute distress.    Appearance: She is well-developed.  HENT:     Head: Normocephalic and atraumatic.     Mouth/Throat:     Pharynx: No oropharyngeal exudate.  Eyes:     Conjunctiva/sclera: Conjunctivae normal.     Pupils: Pupils are equal, round, and reactive  to light.  Neck:     Comments: No meningismus. Cardiovascular:     Rate and Rhythm: Normal rate and regular rhythm.     Heart sounds: Normal heart sounds. No murmur heard. Pulmonary:     Effort: Pulmonary effort is normal. No respiratory distress.     Breath sounds: Normal breath sounds.  Abdominal:     Palpations: Abdomen is soft.     Tenderness: There is no abdominal tenderness. There is no guarding or rebound.  Musculoskeletal:        General: Tenderness present. Normal range of motion.     Cervical back: Normal range of motion and neck supple.     Comments: Right CVA tenderness, no rash  5/5 strength in bilateral lower extremities. Ankle  plantar and dorsiflexion intact. Great toe extension intact bilaterally. +2 DP and PT pulses. +2 patellar reflexes bilaterally. Normal gait.   Skin:    General: Skin is warm.  Neurological:     Mental Status: She is alert and oriented to person, place, and time.     Cranial Nerves: No cranial nerve deficit.     Motor: No abnormal muscle tone.     Coordination: Coordination normal.     Comments:  5/5 strength throughout. CN 2-12 intact.Equal grip strength.   Psychiatric:        Behavior: Behavior normal.     (all labs ordered are listed, but only abnormal results are displayed) Labs Reviewed  CBC WITH DIFFERENTIAL/PLATELET - Abnormal; Notable for the following components:      Result Value   WBC 13.7 (*)    Neutro Abs 11.1 (*)    Monocytes Absolute 1.4 (*)    All other components within normal limits  COMPREHENSIVE METABOLIC PANEL WITH GFR - Abnormal; Notable for the following components:   Sodium 133 (*)    Glucose, Bld 122 (*)    Creatinine, Ser 1.06 (*)    Calcium  8.7 (*)    GFR, Estimated 59 (*)    All other components within normal limits  URINALYSIS, ROUTINE W REFLEX MICROSCOPIC - Abnormal; Notable for the following components:   Ketones, ur 5 (*)    Leukocytes,Ua TRACE (*)    Bacteria, UA RARE (*)    All other components within normal limits  D-DIMER, QUANTITATIVE - Abnormal; Notable for the following components:   D-Dimer, Quant 1.58 (*)    All other components within normal limits  URINE CULTURE  LIPASE, BLOOD  TROPONIN I (HIGH SENSITIVITY)  TROPONIN I (HIGH SENSITIVITY)    EKG: EKG Interpretation Date/Time:  Tuesday August 21 2024 01:29:08 EDT Ventricular Rate:  71 PR Interval:  166 QRS Duration:  109 QT Interval:  421 QTC Calculation: 458 R Axis:   20  Text Interpretation: Sinus rhythm Consider left atrial enlargement No significant change was found Confirmed by Carita Senior [id number]) on 08/21/2024 1:33:54 AM  Radiology: CT Angio Chest PE W and/or  Wo Contrast Result Date: 08/21/2024 EXAM: CTA of the Chest with contrast for PE 08/21/2024 02:00:10 AM TECHNIQUE: CTA of the chest was performed after the administration of 75 mL iohexol  (OMNIPAQUE ) 350 MG/ML injection. Multiplanar reformatted images are provided for review. MIP images are provided for review. Automated exposure control, iterative reconstruction, and/or weight based adjustment of the mA/kV was utilized to reduce the radiation dose to as low as reasonably achievable. COMPARISON: None available. CLINICAL HISTORY: Pulmonary embolism (PE) suspected, high prob; abnormal base of R lung on CT. 75cc omni350; Pulmonary embolism (PE) suspected, high  prob; abnormal base of R lung on CT. FINDINGS: PULMONARY ARTERIES: Pulmonary arteries are adequately opacified for evaluation. Multiple intraluminal filling defects are identified within the lateral and posterior basal segmental pulmonary arteries of the right lower lobe. The embolic burden is small. Main pulmonary artery is normal in caliber. MEDIASTINUM: The heart and pericardium demonstrate no acute abnormality. Global cardiac size within normal limits. No pericardial effusion. No significant coronary artery calcification. There is no CT evidence of right heart strain. There is no acute abnormality of the thoracic aorta. No atherosclerotic calcification within the thoracic aorta. No aortic aneurysm. Central pulmonary arteries are of normal caliber. LYMPH NODES: No mediastinal, hilar or axillary lymphadenopathy. LUNGS AND PLEURA: Developing patchy infiltrate within the right lower lobe is in keeping with a developing pulmonary infarct within the posterior right costophrenic sulcus. Trace right pleural effusion noted. No pleural effusion on the left. No pneumothorax. No pulmonary edema. UPPER ABDOMEN: Limited images of the upper abdomen are unremarkable. SOFT TISSUES AND BONES: No acute bone or soft tissue abnormality. IMPRESSION: 1. Small pulmonary emboli in  the lateral and posterior basal segmental pulmonary arteries of the right lower lobe. 2. Developing pulmonary infarct in the posterior right costophrenic sulcus with trace right pleural effusion. 3. No CT evidence of right heart strain. Electronically signed by: Dorethia Molt MD 08/21/2024 02:15 AM EDT RP Workstation: [id number]   DG Chest 2 View Result Date: 08/21/2024 EXAM: 2 VIEW(S) XRAY OF THE CHEST 08/21/2024 01:41:00 AM COMPARISON: Chest x-ray 12/18/2020. CLINICAL HISTORY: R flank pain. Encounter for right flank pain. FINDINGS: LUNGS AND PLEURA: No focal pulmonary opacity. No pulmonary edema. No pleural effusion. No pneumothorax. HEART AND MEDIASTINUM: No acute abnormality of the cardiac and mediastinal silhouettes. BONES AND SOFT TISSUES: No acute osseous abnormality. IMPRESSION: 1. No acute process. Electronically signed by: Greig Pique MD 08/21/2024 01:46 AM EDT RP Workstation: [id number]   CT Renal Stone Study Result Date: 08/20/2024 CLINICAL DATA:  Provided history: Abdominal/flank pain, stone suspected EXAM: CT ABDOMEN AND PELVIS WITHOUT CONTRAST TECHNIQUE: Multidetector CT imaging of the abdomen and pelvis was performed following the standard protocol without IV contrast. RADIATION DOSE REDUCTION: This exam was performed according to the departmental dose-optimization program which includes automated exposure control, adjustment of the mA and/or kV according to patient size and/or use of iterative reconstruction technique. COMPARISON:  Abdominal MRI 02/21/2018 FINDINGS: Lower chest: Subpleural ground-glass and consolidative opacities in the right lower lobe with minimal pleural thickening. Punctate calcified granuloma in the right lower lobe. Hepatobiliary: No focal liver abnormality is seen. No gallstones, gallbladder wall thickening, or biliary dilatation. Pancreas: Unremarkable. No pancreatic ductal dilatation or surrounding inflammatory changes. Spleen: Normal in size without focal  abnormality. Adrenals/Urinary Tract: No adrenal nodule. No hydronephrosis. No renal calculi. Exophytic lesion arising from the lower left kidney measuring 14 mm has Hounsfield units of 74 and is consistent with a hemorrhagic cyst. This is decreased in size from prior exam. Right renal cyst on prior MRI are not seen on this unenhanced exam. No further follow-up imaging is recommended. Nondistended urinary bladder. No bladder stone or wall thickening. Stomach/Bowel: Stomach is within normal limits. Appendix appears normal. No evidence of bowel wall thickening, distention, or inflammatory changes. Vascular/Lymphatic: Normal caliber abdominal aorta. No portal venous or mesenteric gas. No abdominopelvic adenopathy. Reproductive: The uterus is bulbous suggesting underlying fibroids. No adnexal mass. Other: No free air, free fluid, or intra-abdominal fluid collection. Musculoskeletal: There are no acute or suspicious osseous abnormalities. IMPRESSION: 1. No renal stones or obstructive  uropathy. No acute abnormality in the abdomen/pelvis. 2. Subpleural ground-glass and consolidative opacities in the right lower lobe with minimal pleural thickening. This may represent pneumonia in the appropriate clinical setting. Pulmonary infarct could have a similar appearance. If clinically indicated, recommend chest CTA for assessment of pulmonary embolus. Electronically Signed   By: Andrea Gasman M.D.   On: 08/20/2024 22:01     .Critical Care  Performed by: Carita Senior, MD Authorized by: Carita Senior, MD   Critical care provider statement:    Critical care time (minutes):  35   Critical care time was exclusive of:  Separately billable procedures and treating other patients   Critical care was necessary to treat or prevent imminent or life-threatening deterioration of the following conditions:  Respiratory failure (Pulmonary emboli)   Critical care was time spent personally by me on the following activities:   Development of treatment plan with patient or surrogate, discussions with consultants, evaluation of patient's response to treatment, examination of patient, ordering and review of laboratory studies, ordering and review of radiographic studies, ordering and performing treatments and interventions, pulse oximetry, re-evaluation of patient's condition, review of old charts, blood draw for specimens and obtaining history from patient or surrogate   I assumed direction of critical care for this patient from another provider in my specialty: no     Care discussed with: admitting provider      Medications Ordered in the ED  predniSONE (DELTASONE) tablet 60 mg (60 mg Oral Given 08/20/24 2203)  diphenhydrAMINE (BENADRYL) capsule 50 mg (50 mg Oral Given 08/20/24 2203)                                    Medical Decision Making Amount and/or Complexity of Data Reviewed Labs: ordered. Decision-making details documented in ED Course. Radiology: ordered and independent interpretation performed. Decision-making details documented in ED Course. ECG/medicine tests: ordered and independent interpretation performed. Decision-making details documented in ED Course.  Risk Prescription drug management.   Right-sided flank pain x 24 hours.  Stable vitals.  No distress.  Abdomen soft without peritoneal signs.  Intact distal strength, sensation, pulses and reflexes.  Low concern for cord compression or cauda equina.  Urinalysis in triage shows pyuria without true UTI symptoms.  Will culture. Does have leukocytosis of 13.  CT scan in triage is negative for ureterolithiasis or hydronephrosis.  Does show ground glass opacities right lung base.  She denies any recent cough or fever.  Will check chest x-ray as well as rule out pulmonary embolism.  CT is positive for small pulmonary emboli with pulmonary infarct.  No right heart strain.  Discussed with Dr. Kimberlee of radiology.   EKG is sinus rhythm without acute  ST changes.  Troponin negative.  Suspect her flank pain is secondary to pulmonary emboli.  She remains hemodynamically stable without hypoxia or increased work of breathing.  Will initiate Eliquis. Given her pulmonary infarct we will plan observation admission for echocardiogram and monitoring.  Discussed with Dr. Segars.     Final diagnoses:  None    ED Discharge Orders     None          Halston Fairclough, Senior, MD 08/21/24 [id number]

## 2024-08-21 NOTE — ED Notes (Signed)
 Pt to xray

## 2024-08-21 NOTE — ED Notes (Signed)
 CCMD called.

## 2024-08-21 NOTE — H&P (Signed)
 History and Physical    Caitlyn Schmidt FMW:[id number] DOB: 03/21/1961 DOA: 08/20/2024  PCP: Norleen Lynwood ORN, MD   Patient coming from: Home   Chief Complaint:  Chief Complaint  Patient presents with   Right Flank Pain     HPI:  Caitlyn Schmidt is a 63 y.o. female with hx of hypertension, hyperlipidemia, CKD 3a, remote hx melanoma, who presented with R sided flank pain. Reports that she had sudden onset of symptoms yesterday. Pleuritic type pain which was worse with movement and minimal exertion. No significant shortness of breath. No anterior chest pain. She had not noted any asymmetric leg swelling. 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 No recent car / plane trips. No injuries to her legs. No prior or family of VTE. No hx of bleeding.    Review of Systems:  ROS complete and negative except as marked above   Allergies  Allergen Reactions   Prednisone     REACTION: Jittery    Prior to Admission medications   Medication Sig Start Date End Date Taking? Authorizing Provider  Calcium -Vitamin D -Vitamin K (CALCIUM  SOFT CHEWS PO) Take by mouth.    [provider]  cholecalciferol (VITAMIN D ) 1000 UNITS tablet Take 4,000 Units by mouth daily.    [provider]  metoprolol  tartrate (LOPRESSOR ) 25 MG tablet Take 0.5 tablets (12.5 mg total) by mouth daily as needed (for BP>150/90 mmhg.). 12/30/23 12/29/24  Norleen Lynwood ORN, MD  Multiple Vitamin (MULTIVITAMIN) tablet Take 1 tablet by mouth daily.    [provider]  Omega-3 Fatty Acids (FISH OIL OMEGA-3 PO) Take 1,400 mg by mouth daily in the afternoon.    [provider]  rosuvastatin  (CRESTOR ) 20 MG tablet TAKE 1 TABLET BY MOUTH EVERY DAY 03/06/24   Norleen Lynwood ORN, MD  triamcinolone  (NASACORT  ALLERGY 24HR) 55 MCG/ACT AERO nasal inhaler Place 2 sprays into the nose daily. 12/08/23 01/19/24  Tobie Eldora KATHEE, MD    Past Medical History:  Diagnosis Date    ADD (attention deficit disorder)    Allergic rhinitis, mild    Essential hypertension 06/04/2015   Hyperlipidemia    IBS (irritable bowel syndrome)    Melanoma (HCC) 1995    Past Surgical History:  Procedure Laterality Date   COLPOSCOPY     WRIST SURGERY  2010     reports that she has never smoked. She has never used smokeless tobacco. She reports current alcohol use. She reports that she does not use drugs.  Family History  Problem Relation Age of Onset   Hypertension Mother    Thyroid  disease Mother    Cancer Mother        skin   Hypertension Father    Hyperlipidemia Father    Cancer Maternal Grandmother        multiple skin cancer   Diabetes Maternal Grandmother    Hypertension Maternal Grandmother    Osteoporosis Maternal Grandmother    Macular degeneration Maternal Grandmother    Gout Maternal Grandfather    Heart disease Maternal Grandfather    Diabetes Maternal Grandfather    Kidney disease Maternal Grandfather    Heart disease Paternal Grandmother    Heart disease Paternal Grandfather    Cancer Paternal Grandfather        prostate     Physical Exam: Vitals:   08/20/24 2121 08/21/24 0144 08/21/24 0145 08/21/24 0230  BP:   134/64 119/68  Pulse:  70 64  Resp:   18 19  Temp:  (!) 97.5 F (36.4 C)    TempSrc:  Oral    SpO2:   99% 97%  Weight: 69.9 kg       Gen: Awake, alert, NAD   CV: Regular, normal S1, S2, no murmurs  Resp: Normal WOB, minimal  Abd: Flat, normoactive, nontender MSK: Symmetric, no edema  Skin: No rashes or lesions to exposed skin  Neuro: Alert and interactive  Psych: euthymic, appropriate    Data review:   Labs reviewed, notable for:   Creatinine 1.06 at baseline High-sensitivity troponin 3 WBC 13 Platelet 214 D-dimer 1.5 UA equivocal rare bacteria, pyuria, trace leukocytes, small ketone Urine culture was sent  Micro:  Results for orders placed or performed in visit on 10/23/20  Novel Coronavirus, NAA ([facility])      Status: None   Collection Time: 10/23/20  9:17 AM   Specimen: Nasopharyngeal(NP) swabs in vial transport medium   Nasopharynge  Screenin  Result Value Ref Range Status   SARS-CoV-2, NAA Not Detected Not Detected Final    Comment: This nucleic acid amplification test was developed and its performance characteristics determined by [employer]. Nucleic acid amplification tests include RT-PCR and TMA. This test has not been FDA cleared or approved. This test has been authorized by FDA under an Emergency Use Authorization (EUA). This test is only authorized for the duration of time the declaration that circumstances exist justifying the authorization of the emergency use of in vitro diagnostic tests for detection of SARS-CoV-2 virus and/or diagnosis of COVID-19 infection under section 564(b)(1) of the Act, 21 U.S.C. 639aaa-6(a) (1), unless the authorization is terminated or revoked sooner. When diagnostic testing is negative, the possibility of a false negative result should be considered in the context of a patient's recent exposures and the presence of clinical signs and symptoms consistent with COVID-19. An individual without symptoms of COVID-19 and who is not shedding SARS-CoV-2 virus wo uld expect to have a negative (not detected) result in this assay.   SARS-COV-2, NAA 2 DAY TAT     Status: None   Collection Time: 10/23/20  9:17 AM   Nasopharynge  Screenin  Result Value Ref Range Status   SARS-CoV-2, NAA 2 DAY TAT Performed  Final    Imaging reviewed:  CT Angio Chest PE W and/or Wo Contrast Result Date: 08/21/2024 EXAM: CTA of the Chest with contrast for PE 08/21/2024 02:00:10 AM TECHNIQUE: CTA of the chest was performed after the administration of 75 mL iohexol  (OMNIPAQUE ) 350 MG/ML injection. Multiplanar reformatted images are provided for review. MIP images are provided for review. Automated exposure control, iterative reconstruction, and/or weight based adjustment of  the mA/kV was utilized to reduce the radiation dose to as low as reasonably achievable. COMPARISON: None available. CLINICAL HISTORY: Pulmonary embolism (PE) suspected, high prob; abnormal base of R lung on CT. 75cc omni350; Pulmonary embolism (PE) suspected, high prob; abnormal base of R lung on CT. FINDINGS: PULMONARY ARTERIES: Pulmonary arteries are adequately opacified for evaluation. Multiple intraluminal filling defects are identified within the lateral and posterior basal segmental pulmonary arteries of the right lower lobe. The embolic burden is small. Main pulmonary artery is normal in caliber. MEDIASTINUM: The heart and pericardium demonstrate no acute abnormality. Global cardiac size within normal limits. No pericardial effusion. No significant coronary artery calcification. There is no CT evidence of right heart strain. There is no acute abnormality of the thoracic aorta. No atherosclerotic calcification within the thoracic  aorta. No aortic aneurysm. Central pulmonary arteries are of normal caliber. LYMPH NODES: No mediastinal, hilar or axillary lymphadenopathy. LUNGS AND PLEURA: Developing patchy infiltrate within the right lower lobe is in keeping with a developing pulmonary infarct within the posterior right costophrenic sulcus. Trace right pleural effusion noted. No pleural effusion on the left. No pneumothorax. No pulmonary edema. UPPER ABDOMEN: Limited images of the upper abdomen are unremarkable. SOFT TISSUES AND BONES: No acute bone or soft tissue abnormality. IMPRESSION: 1. Small pulmonary emboli in the lateral and posterior basal segmental pulmonary arteries of the right lower lobe. 2. Developing pulmonary infarct in the posterior right costophrenic sulcus with trace right pleural effusion. 3. No CT evidence of right heart strain. Electronically signed by: Dorethia Molt MD 08/21/2024 02:15 AM EDT RP Workstation: [id number]   DG Chest 2 View Result Date: 08/21/2024 EXAM: 2 VIEW(S) XRAY OF  THE CHEST 08/21/2024 01:41:00 AM COMPARISON: Chest x-ray 12/18/2020. CLINICAL HISTORY: R flank pain. Encounter for right flank pain. FINDINGS: LUNGS AND PLEURA: No focal pulmonary opacity. No pulmonary edema. No pleural effusion. No pneumothorax. HEART AND MEDIASTINUM: No acute abnormality of the cardiac and mediastinal silhouettes. BONES AND SOFT TISSUES: No acute osseous abnormality. IMPRESSION: 1. No acute process. Electronically signed by: Greig Pique MD 08/21/2024 01:46 AM EDT RP Workstation: [id number]   CT Renal Stone Study Result Date: 08/20/2024 CLINICAL DATA:  Provided history: Abdominal/flank pain, stone suspected EXAM: CT ABDOMEN AND PELVIS WITHOUT CONTRAST TECHNIQUE: Multidetector CT imaging of the abdomen and pelvis was performed following the standard protocol without IV contrast. RADIATION DOSE REDUCTION: This exam was performed according to the departmental dose-optimization program which includes automated exposure control, adjustment of the mA and/or kV according to patient size and/or use of iterative reconstruction technique. COMPARISON:  Abdominal MRI 02/21/2018 FINDINGS: Lower chest: Subpleural ground-glass and consolidative opacities in the right lower lobe with minimal pleural thickening. Punctate calcified granuloma in the right lower lobe. Hepatobiliary: No focal liver abnormality is seen. No gallstones, gallbladder wall thickening, or biliary dilatation. Pancreas: Unremarkable. No pancreatic ductal dilatation or surrounding inflammatory changes. Spleen: Normal in size without focal abnormality. Adrenals/Urinary Tract: No adrenal nodule. No hydronephrosis. No renal calculi. Exophytic lesion arising from the lower left kidney measuring 14 mm has Hounsfield units of 74 and is consistent with a hemorrhagic cyst. This is decreased in size from prior exam. Right renal cyst on prior MRI are not seen on this unenhanced exam. No further follow-up imaging is recommended. Nondistended urinary  bladder. No bladder stone or wall thickening. Stomach/Bowel: Stomach is within normal limits. Appendix appears normal. No evidence of bowel wall thickening, distention, or inflammatory changes. Vascular/Lymphatic: Normal caliber abdominal aorta. No portal venous or mesenteric gas. No abdominopelvic adenopathy. Reproductive: The uterus is bulbous suggesting underlying fibroids. No adnexal mass. Other: No free air, free fluid, or intra-abdominal fluid collection. Musculoskeletal: There are no acute or suspicious osseous abnormalities. IMPRESSION: 1. No renal stones or obstructive uropathy. No acute abnormality in the abdomen/pelvis. 2. Subpleural ground-glass and consolidative opacities in the right lower lobe with minimal pleural thickening. This may represent pneumonia in the appropriate clinical setting. Pulmonary infarct could have a similar appearance. If clinically indicated, recommend chest CTA for assessment of pulmonary embolus. Electronically Signed   By: Andrea Gasman M.D.   On: 08/20/2024 22:01    EKG:  Personally reviewed, sinus rhythm, with no acute ischemic changes  ED Course:  Prednisone and benadryl for arm rash  Ordered for Eliquis for PE.  Due to her  symptoms and pulmonary infarct referred for observation   Assessment/Plan:  63 y.o. female with hx hypertension, hyperlipidemia, CKD 3a, remote hx melanoma, who presented with R sided flank pain, found to have small pulmonary emboli in the right lower lobe with associated pulmonary infarct.   Pulmonary embolism RLL, associated pulmonary infarct, without RV strain Acute onset of right flank pleuritic / exertional pain x 1 day. Vitals are reassuring, not tachycardic, normal sat on room air.  High-sensitivity troponin is 3.  Initially infarct was suggested on a CT abdomen pelvis which was followed by CTA PE which demonstrates small segmental PE in the RLL L with associated developing infarct and trace pleural effusion.,  No RV strain.  Feel that she has provoked PE with transient minor risk factor although potentially persistent with her sedentary behavior at home. No other identifiable risk factors.  -- OK to begin on Apixaban load + maintenance dosing as ordered; can followup with PCP re: duration of anticoagulation, as she is intermediate risk of recurrence.  -- TTE, venous duplex lower extremities  -- Tele monitoring  -- Incentive spirometry  -- As long as she remains stable, and symptoms from her infarct are controlled I think can likely be discharged later today  -- Home O2 desat screen ordered  -- Recommend that she f/u with PCP and ensure up to date on cancer screenings   Recent sinus congestion, chills  -- Check Flu / COVID / RSV   Rash, arm  -- Treated with Prednisone and Benadryl in the ED, monitor   Chronic medical problems:  HTN: Not taking medication  HLD: Not taking cholesterol lowering meds  CKD3a: Cr near baseline 1  Remote hx melanoma: Noted, follows with dermatology    Body mass index is 22.42 kg/m.    DVT prophylaxis:  Eliquis Code Status:  Full Code Diet:  Diet Orders (From admission, onward)    None      Family Communication:  None   Consults:  None   Admission status:   Observation, Telemetry bed  Severity of Illness: The appropriate patient status for this patient is OBSERVATION. Observation status is judged to be reasonable and necessary in order to provide the required intensity of service to ensure the patient's safety. The patient's presenting symptoms, physical exam findings, and initial radiographic and laboratory data in the context of their medical condition is felt to place them at decreased risk for further clinical deterioration. Furthermore, it is anticipated that the patient will be medically stable for discharge from the hospital within 2 midnights of admission.    Dorn Dawson, MD [facility]  How to contact the [facility] Attending or Consulting provider 7A -  7P or covering provider during after hours 7P -7A, for this patient.  Check the care team in [facility] and look for a) attending/consulting [facility] provider listed and b) the [facility] team listed Log into [email or web address] and use [place]'s universal password to access. If you do not have the password, please contact the hospital operator. Locate the [facility] provider you are looking for under [facility] and page to a number that you can be directly reached. If you still have difficulty reaching the provider, please page the [facility] (Director on Call) for the Hospitalists listed on amion for assistance.  08/21/2024, 2:49 AM

## 2024-08-21 NOTE — ED Notes (Signed)
 CCMD called for confirmation that pt is on the monitor.

## 2024-08-21 NOTE — Discharge Summary (Signed)
 Physician Discharge Summary  Caitlyn Schmidt FMW:[id number] DOB: 12/01/1960 DOA: 08/20/2024  PCP: Norleen Lynwood ORN, MD  Admit date: 08/20/2024 Discharge date: 08/21/2024  Admitted from: Home Discharge disposition: Home  Recommendations at discharge:  You have been started on a blood thinner named Eliquis.  Please watch out obvious bleeding, black stool or easy bruisability. Order placed for outpatient ultrasound duplex of lower extremities Follow-up with hematology as an outpatient.  Outpatient referral given. Pain control with extra-strength Tylenol , Lidoderm patch Tessalon Perles for cough  Subjective: Patient was seen and examined this afternoon. Pleasant middle-aged Caucasian female.  Lying down on bed. Chest pain tolerable Remains hemodynamically stable.  Brief narrative: Caitlyn Schmidt is a 63 y.o. female with PMH significant for HTN, HLD, CKD, remote history of melanoma, ADD, IBS 10/27, patient presented to the ED with right-sided flank pain for 24 hours, pleuritic in nature worse with movement and minimal exertion. Did not complain of chest pain or shortness of breath  In the ED, hemodynamically stable, breathing room air Initial workup with WC count 13.7, sodium 133, troponin normal, D-dimer elevated 1.58 CT renal study did not show any stones or obstruction but suggested right lower lobe consolidation and hence CTA chest was obtained CTA chest showed  -small pulm embolism in the lateral and posterior basal segmental pulmonary arteries of the right lower lobe. -Developing pulmonary infarct in the posterior right costophrenic sulcus with trace right pleural effusion. -No CT evidence of right heart strain.  Started on Eliquis Admitted to [facility]  Assessment and plan: Acute right-sided segmental PE Presented with acute onset right flank/pleuritic pain exertional in nature. 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 No recent long travel or immobility otherwise. No prior history of bleeding, DVT/PE, family history of DVT/PE Echocardiogram with EF 65 to 70%, RV size and function normal, no interatrial connection Has been started on Eliquis.  Needs anticoagulation at least for 6 months.  Need to follow-up with oncology as an outpatient. Pending ultrasound duplex lower extremity.  Because of the high census in case volumes, patient is unable to get ultrasound duplex done in the hospital today.  Since she has already been started on Eliquis and she does not have any appreciable swelling on lower extremities, I have decided to cancel the inpatient order and get it done as an outpatient.  I have sent follow-up request to her PCP Dr. Lynwood Norleen. Also given referral for hematology/oncology evaluation given her history of melanoma. For pain control, use extra strength Tylenol , Lidoderm patch.  I offered low-dose oxycodone but patient refused it. For cough, Tessalon Perles have been prescribed  Recent sinus congestion, chills  Respiratory panel unremarkable   Rash, arm  Treated with Prednisone and Benadryl in the ED  HTN: Not taking medication.  Blood pressure in normal range. HLD: Not taking cholesterol lowering meds  CKD3a: Cr near baseline 1  Remote hx melanoma: Noted, follows with dermatology   Goals of care   Code Status: Full Code   Diet:  Diet Order             Diet regular Room service appropriate? Yes; Fluid consistency: Thin  Diet effective now           Diet general                   Nutritional status:  Body mass index is 23.28 kg/m.  Wounds:  -   Discharge Medications:   Allergies as of 08/21/2024       Reactions   Prednisone    REACTION: Jittery        Medication List     TAKE these medications    acetaminophen  500 MG tablet Commonly known as: TYLENOL  Take 2 tablets (1,000 mg total) by mouth every 6 (six)  hours as needed for mild pain (pain score 1-3).   albuterol  (2.5 MG/3ML) 0.083% nebulizer solution Commonly known as: PROVENTIL  Take 3 mLs (2.5 mg total) by nebulization every 4 (four) hours as needed for wheezing or shortness of breath.   Apixaban Starter Pack (10mg  and 5mg ) Commonly known as: ELIQUIS STARTER PACK Take as directed on package: start with two-5mg  tablets twice daily for 7 days. On day 8, switch to one-5mg  tablet twice daily.   benzonatate 100 MG capsule Commonly known as: Tessalon Perles Take 1 capsule (100 mg total) by mouth 2 (two) times daily for 7 days.   CALCIUM -VITAMIN D -VITAMIN K PO Take 1 tablet by mouth daily.   cholecalciferol 1000 units tablet Commonly known as: VITAMIN D  Take 4,000 Units by mouth daily.   FISH OIL OMEGA-3 PO Take 1,400 mg by mouth daily in the afternoon.   lidocaine 5 % Commonly known as: LIDODERM Place 1 patch onto the skin daily. Remove & Discard patch within 12 hours or as directed by MD   metoprolol  tartrate 25 MG tablet Commonly known as: LOPRESSOR  Take 0.5 tablets (12.5 mg total) by mouth daily as needed (for BP>150/90 mmhg.).   multivitamin tablet Take 1 tablet by mouth daily.   OVER THE COUNTER MEDICATION Take 3 capsules by mouth daily. [place] Mushroom   rosuvastatin  20 MG tablet Commonly known as: CRESTOR  TAKE 1 TABLET BY MOUTH EVERY DAY         Follow ups:    Follow-up Information     Norleen Lynwood ORN, MD Follow up.   Specialties: Internal Medicine, Radiology Contact information: [address] [place] [address] [phone]         Federico Norleen ONEIDA MADISON, MD Follow up.   Specialty: Hematology and Oncology Contact information: [address] [place] [address] [phone]                 Discharge Instructions:   Discharge Instructions     Call MD for:  difficulty breathing, headache or visual disturbances   Complete by: As directed    Call MD for:  extreme fatigue    Complete by: As directed    Call MD for:  hives   Complete by: As directed    Call MD for:  persistant dizziness or light-headedness   Complete by: As directed    Call MD for:  persistant nausea and vomiting   Complete by: As directed    Call MD for:  severe uncontrolled pain   Complete by: As directed    Call MD for:  temperature >100.4   Complete by: As directed    Diet general   Complete by: As directed    Discharge instructions   Complete by: As directed    Recommendations at discharge:   You have been started on a blood thinner named Eliquis.  Please watch out obvious bleeding, black stool or easy bruisability.  Follow-up with hematology as an outpatient.  Outpatient referral given.  Pain control with extra-strength Tylenol , Lidoderm patch  Tessalon Perles for cough  General discharge instructions: Follow with  Primary MD Norleen Lynwood ORN, MD in 7 days  Please request your PCP  to go over your hospital tests, procedures, radiology results at the follow up. Please get your medicines reviewed and adjusted.  Your PCP may decide to repeat certain labs or tests as needed. Do not drive, operate heavy machinery, perform activities at heights, swimming or participation in water activities or provide baby sitting services if your were admitted for syncope or siezures until you have seen by Primary MD or a Neurologist and advised to do so again. [place]  Controlled Substance Reporting System database was reviewed. Do not drive, operate heavy machinery, perform activities at heights, swim, participate in water activities or provide baby-sitting services while on medications for pain, sleep and mood until your outpatient physician has reevaluated you and advised to do so again.  You are strongly recommended to comply with the dose, frequency and duration of prescribed medications. Activity: As tolerated with Full fall precautions use walker/cane & assistance as needed Avoid using any  recreational substances like cigarette, tobacco, alcohol, or non-prescribed drug. If you experience worsening of your admission symptoms, develop shortness of breath, life threatening emergency, suicidal or homicidal thoughts you must seek medical attention immediately by calling 911 or calling your MD immediately  if symptoms less severe. You must read complete instructions/literature along with all the possible adverse reactions/side effects for all the medicines you take and that have been prescribed to you. Take any new medicine only after you have completely understood and accepted all the possible adverse reactions/side effects.  Wear Seat belts while driving. You were cared for by a hospitalist during your hospital stay. If you have any questions about your discharge medications or the care you received while you were in the hospital after you are discharged, you can call the unit and ask to speak with the hospitalist or the covering physician. Once you are discharged, your primary care physician will handle any further medical issues. Please note that NO REFILLS for any discharge medications will be authorized once you are discharged, as it is imperative that you return to your primary care physician (or establish a relationship with a primary care physician if you do not have one).   Increase activity slowly   Complete by: As directed    VAS US  LOWER EXTREMITY VENOUS (DVT)   Complete by: ASAP    Laterality: Bilateral   Vascular Diagnosis: Pulmonary embolism personal history of Z86.711   If positive for DVT and patient meets criteria for outpatient management, refer to [facility] for treatment. Questions call #:[phone]: Yes   Where should this test be performed: [facility]             Discharge Exam:   Vitals:   08/21/24 1000 08/21/24 1100 08/21/24 1338 08/21/24 1651  BP: 127/86 129/71 133/65 126/71  Pulse: 76 72 60 77  Resp: 18  18 18   Temp:    98.2  F (36.8 C)  TempSrc:   Oral Oral  SpO2: 98% 98%  100%  Weight:   71.5 kg   Height:   5' 9 (1.753 m)     Body mass index is 23.28 kg/m.   General exam: Pleasant, not in pain at the time of my evaluation Skin: No rashes, lesions or ulcers. HEENT: Atraumatic, normocephalic, no obvious bleeding Lungs: Clear to auscultation bilaterally, tender on right lower chest following deep breathing CVS: S1, S2, no murmur,   GI/Abd: Soft, nontender, nondistended, bowel  sound present,   CNS: Alert, awake, oriented x 3 Psychiatry: Mood appropriate Extremities: No pedal edema, no calf tenderness   The results of significant diagnostics from this hospitalization (including imaging, microbiology, ancillary and laboratory) are listed below for reference.    Procedures and Diagnostic Studies:   ECHOCARDIOGRAM COMPLETE Result Date: 08/21/2024    ECHOCARDIOGRAM REPORT   Patient Name:   Caitlyn Schmidt Date of Exam: 08/21/2024 Medical Rec #:  [id number]       Height:       69.0 in Accession #:    [id number]      Weight:       157.6 lb Date of Birth:  December 01, 1960       BSA:          1.867 m Patient Age:    63 years        BP:           133/65 mmHg Patient Gender: F               HR:           65 bpm. Exam Location:  Inpatient Procedure: 2D Echo, Cardiac Doppler, Color Doppler and 3D Echo (Both Spectral            and Color Flow Doppler were utilized during procedure). Indications:    Pulmonary Embolus  History:        Patient has prior history of Echocardiogram examinations, most                 recent 01/29/2023. Risk Factors:Dyslipidemia.  Sonographer:    Philomena Daring Referring Phys: [id number] [facility]  Sonographer Comments: Suboptimal parasternal window. Image acquisition challenging due to patient body habitus. IMPRESSIONS  1. Left ventricular ejection fraction, by estimation, is 65 to 70%. Left ventricular ejection fraction by 3D volume is 70 %. The left ventricle has normal function. The left ventricle has no  regional wall motion abnormalities. Left ventricular diastolic  parameters were normal.  2. Right ventricular systolic function is hyperdynamic. The right ventricular size is normal.  3. The mitral valve is normal in structure. No evidence of mitral valve regurgitation. No evidence of mitral stenosis.  4. The aortic valve was not well visualized. Aortic valve regurgitation is not visualized.  5. The inferior vena cava is normal in size with <50% respiratory variability, suggesting right atrial pressure of 8 mmHg. Comparison(s): Prior images reviewed side by side. Function is more dynamic in this study. FINDINGS  Left Ventricle: Left ventricular ejection fraction, by estimation, is 65 to 70%. Left ventricular ejection fraction by 3D volume is 70 %. The left ventricle has normal function. The left ventricle has no regional wall motion abnormalities. The left ventricular internal cavity size was normal in size. There is no left ventricular hypertrophy. Left ventricular diastolic parameters were normal. Right Ventricle: The right ventricular size is normal. No increase in right ventricular wall thickness. Right ventricular systolic function is hyperdynamic. Left Atrium: Left atrial size was normal in size. Right Atrium: Right atrial size was normal in size. Pericardium: There is no evidence of pericardial effusion. Mitral Valve: The mitral valve is normal in structure. No evidence of mitral valve regurgitation. No evidence of mitral valve stenosis. Tricuspid Valve: The tricuspid valve is normal in structure. Tricuspid valve regurgitation is not demonstrated. No evidence of tricuspid stenosis. Aortic Valve: The aortic valve was not well visualized. Aortic valve regurgitation is not visualized. Pulmonic Valve: The pulmonic valve was not well visualized.  Pulmonic valve regurgitation is not visualized. No evidence of pulmonic stenosis. Aorta: The aortic root is normal in size and structure. Venous: The inferior vena cava is  normal in size with less than 50% respiratory variability, suggesting right atrial pressure of 8 mmHg. IAS/Shunts: The atrial septum is grossly normal. Additional Comments: 3D was performed not requiring image post processing on an independent workstation and was normal.  LEFT VENTRICLE PLAX 2D LVIDd:         3.70 cm         Diastology LVIDs:         2.50 cm         LV e' medial:    8.81 cm/s LV PW:         1.10 cm         LV E/e' medial:  10.9 LV IVS:        1.10 cm         LV e' lateral:   8.92 cm/s LVOT diam:     1.80 cm         LV E/e' lateral: 10.8 LV SV:         73 LV SV Index:   39 LVOT Area:     2.54 cm        3D Volume EF LV IVRT:       74 msec         LV 3D EF:    Left                                             ventricul                                             ar                                             ejection                                             fraction                                             by 3D                                             volume is                                             70 %.  3D Volume EF:                                3D EF:        70 %                                LV EDV:       108 ml                                LV ESV:       33 ml                                LV SV:        75 ml RIGHT VENTRICLE             IVC RV Basal diam:  2.70 cm     IVC diam: 1.60 cm RV Mid diam:    2.30 cm RV S prime:     14.50 cm/s  PULMONARY VEINS TAPSE (M-mode): 1.8 cm      Diastolic Velocity: 45.10 cm/s                             S/D Velocity:       1.30                             Systolic Velocity:  57.60 cm/s LEFT ATRIUM             Index        RIGHT ATRIUM           Index LA diam:        2.80 cm 1.50 cm/m   RA Area:     10.20 cm LA Vol (A2C):   30.2 ml 16.17 ml/m  RA Volume:   18.10 ml  9.69 ml/m LA Vol (A4C):   29.5 ml 15.80 ml/m LA Biplane Vol: 30.3 ml 16.23 ml/m  AORTIC VALVE LVOT Vmax:   151.00 cm/s LVOT Vmean:  98.500 cm/s  LVOT VTI:    0.287 m  AORTA Ao Root diam: 2.50 cm MITRAL VALVE               TRICUSPID VALVE MV Area (PHT): 3.31 cm    TR Peak grad:   5.5 mmHg MV Decel Time: 229 msec    TR Vmax:        117.00 cm/s MV E velocity: 95.90 cm/s MV A velocity: 89.10 cm/s  SHUNTS MV E/A ratio:  1.08        Systemic VTI:  0.29 m                            Systemic Diam: 1.80 cm Stanly Leavens MD Electronically signed by Stanly Leavens MD Signature Date/Time: 08/21/2024/4:16:39 PM    Final    CT Angio Chest PE W and/or Wo Contrast Result Date: 08/21/2024 EXAM: CTA of the Chest with contrast for PE 08/21/2024 02:00:10 AM TECHNIQUE: CTA of the chest was performed after the administration of 75 mL iohexol  (OMNIPAQUE ) 350 MG/ML injection. Multiplanar reformatted images are  provided for review. MIP images are provided for review. Automated exposure control, iterative reconstruction, and/or weight based adjustment of the mA/kV was utilized to reduce the radiation dose to as low as reasonably achievable. COMPARISON: None available. CLINICAL HISTORY: Pulmonary embolism (PE) suspected, high prob; abnormal base of R lung on CT. 75cc [id number]; Pulmonary embolism (PE) suspected, high prob; abnormal base of R lung on CT. FINDINGS: PULMONARY ARTERIES: Pulmonary arteries are adequately opacified for evaluation. Multiple intraluminal filling defects are identified within the lateral and posterior basal segmental pulmonary arteries of the right lower lobe. The embolic burden is small. Main pulmonary artery is normal in caliber. MEDIASTINUM: The heart and pericardium demonstrate no acute abnormality. Global cardiac size within normal limits. No pericardial effusion. No significant coronary artery calcification. There is no CT evidence of right heart strain. There is no acute abnormality of the thoracic aorta. No atherosclerotic calcification within the thoracic aorta. No aortic aneurysm. Central pulmonary arteries are of normal caliber.  LYMPH NODES: No mediastinal, hilar or axillary lymphadenopathy. LUNGS AND PLEURA: Developing patchy infiltrate within the right lower lobe is in keeping with a developing pulmonary infarct within the posterior right costophrenic sulcus. Trace right pleural effusion noted. No pleural effusion on the left. No pneumothorax. No pulmonary edema. UPPER ABDOMEN: Limited images of the upper abdomen are unremarkable. SOFT TISSUES AND BONES: No acute bone or soft tissue abnormality. IMPRESSION: 1. Small pulmonary emboli in the lateral and posterior basal segmental pulmonary arteries of the right lower lobe. 2. Developing pulmonary infarct in the posterior right costophrenic sulcus with trace right pleural effusion. 3. No CT evidence of right heart strain. Electronically signed by: Dorethia Molt MD 08/21/2024 02:15 AM EDT RP Workstation: [id number]   DG Chest 2 View Result Date: 08/21/2024 EXAM: 2 VIEW(S) XRAY OF THE CHEST 08/21/2024 01:41:00 AM COMPARISON: Chest x-ray 12/18/2020. CLINICAL HISTORY: R flank pain. Encounter for right flank pain. FINDINGS: LUNGS AND PLEURA: No focal pulmonary opacity. No pulmonary edema. No pleural effusion. No pneumothorax. HEART AND MEDIASTINUM: No acute abnormality of the cardiac and mediastinal silhouettes. BONES AND SOFT TISSUES: No acute osseous abnormality. IMPRESSION: 1. No acute process. Electronically signed by: Greig Pique MD 08/21/2024 01:46 AM EDT RP Workstation: [id number]   CT Renal Stone Study Result Date: 08/20/2024 CLINICAL DATA:  Provided history: Abdominal/flank pain, stone suspected EXAM: CT ABDOMEN AND PELVIS WITHOUT CONTRAST TECHNIQUE: Multidetector CT imaging of the abdomen and pelvis was performed following the standard protocol without IV contrast. RADIATION DOSE REDUCTION: This exam was performed according to the departmental dose-optimization program which includes automated exposure control, adjustment of the mA and/or kV according to patient size and/or use  of iterative reconstruction technique. COMPARISON:  Abdominal MRI 02/21/2018 FINDINGS: Lower chest: Subpleural ground-glass and consolidative opacities in the right lower lobe with minimal pleural thickening. Punctate calcified granuloma in the right lower lobe. Hepatobiliary: No focal liver abnormality is seen. No gallstones, gallbladder wall thickening, or biliary dilatation. Pancreas: Unremarkable. No pancreatic ductal dilatation or surrounding inflammatory changes. Spleen: Normal in size without focal abnormality. Adrenals/Urinary Tract: No adrenal nodule. No hydronephrosis. No renal calculi. Exophytic lesion arising from the lower left kidney measuring 14 mm has Hounsfield units of 74 and is consistent with a hemorrhagic cyst. This is decreased in size from prior exam. Right renal cyst on prior MRI are not seen on this unenhanced exam. No further follow-up imaging is recommended. Nondistended urinary bladder. No bladder stone or wall thickening. Stomach/Bowel: Stomach is within normal limits. Appendix appears normal. No evidence  of bowel wall thickening, distention, or inflammatory changes. Vascular/Lymphatic: Normal caliber abdominal aorta. No portal venous or mesenteric gas. No abdominopelvic adenopathy. Reproductive: The uterus is bulbous suggesting underlying fibroids. No adnexal mass. Other: No free air, free fluid, or intra-abdominal fluid collection. Musculoskeletal: There are no acute or suspicious osseous abnormalities. IMPRESSION: 1. No renal stones or obstructive uropathy. No acute abnormality in the abdomen/pelvis. 2. Subpleural ground-glass and consolidative opacities in the right lower lobe with minimal pleural thickening. This may represent pneumonia in the appropriate clinical setting. Pulmonary infarct could have a similar appearance. If clinically indicated, recommend chest CTA for assessment of pulmonary embolus. Electronically Signed   By: Andrea Gasman M.D.   On: 08/20/2024 22:01      Labs:   Basic Metabolic Panel: Recent Labs  Lab 08/20/24 2158 08/21/24 0431  NA 133* 136  K 3.7 3.9  CL 100 104  CO2 23 23  GLUCOSE 122* 190*  BUN 13 12  CREATININE 1.06* 0.96  CALCIUM  8.7* 9.1  MG  --  2.0  PHOS  --  3.0   GFR Estimated Creatinine Clearance: 63.5 mL/min (by C-G formula based on SCr of 0.96 mg/dL). Liver Function Tests: Recent Labs  Lab 08/20/24 2158  AST 21  ALT 14  ALKPHOS 67  BILITOT 0.8  PROT 7.1  ALBUMIN 3.7   Recent Labs  Lab 08/20/24 2158  LIPASE 34   No results for input(s): AMMONIA in the last 168 hours. Coagulation profile No results for input(s): INR, PROTIME in the last 168 hours.  CBC: Recent Labs  Lab 08/20/24 2158 08/21/24 0431  WBC 13.7* 11.2*  NEUTROABS 11.1*  --   HGB 13.4 12.9  HCT 40.9 38.9  MCV 89.3 89.8  PLT 214 183   Cardiac Enzymes: No results for input(s): CKTOTAL, CKMB, CKMBINDEX, TROPONINI in the last 168 hours. BNP: Invalid input(s): POCBNP CBG: No results for input(s): GLUCAP in the last 168 hours. D-Dimer Recent Labs    08/21/24 0131  DDIMER 1.58*   Hgb A1c No results for input(s): HGBA1C in the last 72 hours. Lipid Profile No results for input(s): CHOL, HDL, LDLCALC, TRIG, CHOLHDL, LDLDIRECT in the last 72 hours. Thyroid  function studies No results for input(s): TSH, T4TOTAL, T3FREE, THYROIDAB in the last 72 hours.  Invalid input(s): FREET3 Anemia work up No results for input(s): VITAMINB12, FOLATE, FERRITIN, TIBC, IRON, RETICCTPCT in the last 72 hours. Microbiology Recent Results (from the past 240 hours)  Resp panel by RT-PCR (RSV, Flu A&B, Covid) Anterior Nasal Swab     Status: None   Collection Time: 08/21/24  4:31 AM   Specimen: Anterior Nasal Swab  Result Value Ref Range Status   SARS Coronavirus 2 by RT PCR NEGATIVE NEGATIVE Final   Influenza A by PCR NEGATIVE NEGATIVE Final   Influenza B by PCR NEGATIVE NEGATIVE Final     Comment: (NOTE) The Xpert Xpress SARS-CoV-2/FLU/RSV plus assay is intended as an aid in the diagnosis of influenza from Nasopharyngeal swab specimens and should not be used as a sole basis for treatment. Nasal washings and aspirates are unacceptable for Xpert Xpress SARS-CoV-2/FLU/RSV testing.  Fact Sheet for Patients: bloggercourse.com  Fact Sheet for Healthcare Providers: seriousbroker.it  This test is not yet approved or cleared by the [place]  FDA and has been authorized for detection and/or diagnosis of SARS-CoV-2 by FDA under an Emergency Use Authorization (EUA). This EUA will remain in effect (meaning this test can be used) for the duration of the COVID-19 declaration  under Section 564(b)(1) of the Act, 21 U.S.C. section 360bbb-3(b)(1), unless the authorization is terminated or revoked.     Resp Syncytial Virus by PCR NEGATIVE NEGATIVE Final    Comment: (NOTE) Fact Sheet for Patients: bloggercourse.com  Fact Sheet for Healthcare Providers: seriousbroker.it  This test is not yet approved or cleared by the [place]  FDA and has been authorized for detection and/or diagnosis of SARS-CoV-2 by FDA under an Emergency Use Authorization (EUA). This EUA will remain in effect (meaning this test can be used) for the duration of the COVID-19 declaration under Section 564(b)(1) of the Act, 21 U.S.C. section 360bbb-3(b)(1), unless the authorization is terminated or revoked.  Performed at [facility], [address] [place], [place] [address]     Time coordinating discharge: 45 minutes  Signed: Baily Serpe  [facility] 08/21/2024, 5:00 PM

## 2024-08-21 NOTE — Plan of Care (Signed)

## 2024-08-21 NOTE — Telephone Encounter (Signed)
 Patient Product/process Development Scientist completed.    The patient is insured through [facility]. Patient has [employer], may use a copay card, and/or apply for patient assistance if available.    Ran test claim for Eliquis 5 mg and the current 30 day co-pay is $100.00.   This test claim was processed through [facility]- copay amounts may vary at other pharmacies due to pharmacy/plan contracts, or as the patient moves through the different stages of their insurance plan.     Reyes Sharps, CPHT Pharmacy Technician Patient Advocate Specialist Lead [facility] Patient Advocate Team Direct Number: [phone]  Fax: [phone]

## 2024-08-21 NOTE — ED Notes (Signed)
 PT ambulated to and from the bathroom. Tolerated well.

## 2024-08-21 NOTE — Discharge Instructions (Signed)
 Information on my medicine - ELIQUIS (apixaban)   Why was Eliquis prescribed for you? Eliquis was prescribed to treat blood clots that may have been found in the veins of your legs (deep vein thrombosis) or in your lungs (pulmonary embolism) and to reduce the risk of them occurring again.  What do You need to know about Eliquis ? The starting dose is 10 mg (two 5 mg tablets) taken TWICE daily for the FIRST SEVEN (7) DAYS, then on (enter date)  11/3  the dose is reduced to ONE 5 mg tablet taken TWICE daily.  Eliquis may be taken with or without food.   Try to take the dose about the same time in the morning and in the evening. If you have difficulty swallowing the tablet whole please discuss with your pharmacist how to take the medication safely.  Take Eliquis exactly as prescribed and DO NOT stop taking Eliquis without talking to the doctor who prescribed the medication.  Stopping may increase your risk of developing a new blood clot.  Refill your prescription before you run out.  After discharge, you should have regular check-up appointments with your healthcare provider that is prescribing your Eliquis.    What do you do if you miss a dose? If a dose of ELIQUIS is not taken at the scheduled time, take it as soon as possible on the same day and twice-daily administration should be resumed. The dose should not be doubled to make up for a missed dose.  Important Safety Information A possible side effect of Eliquis is bleeding. You should call your healthcare provider right away if you experience any of the following: Bleeding from an injury or your nose that does not stop. Unusual colored urine (red or dark brown) or unusual colored stools (red or black). Unusual bruising for unknown reasons. A serious fall or if you hit your head (even if there is no bleeding).  Some medicines may interact with Eliquis and might increase your risk of bleeding or clotting while on Eliquis. To help  avoid this, consult your healthcare provider or pharmacist prior to using any new prescription or non-prescription medications, including herbals, vitamins, non-steroidal anti-inflammatory drugs (NSAIDs) and supplements.  This website has more information on Eliquis (apixaban): http://www.eliquis.com/eliquis/home

## 2024-08-22 ENCOUNTER — Ambulatory Visit (HOSPITAL_COMMUNITY)
Admission: RE | Admit: 2024-08-22 | Discharge: 2024-08-22 | Disposition: A | Discharge status: Home / Self Care | Source: Ambulatory Visit | Attending: Internal Medicine | Admitting: Internal Medicine

## 2024-08-22 ENCOUNTER — Ambulatory Visit: Payer: Self-pay | Admitting: Internal Medicine

## 2024-08-22 ENCOUNTER — Telehealth: Payer: Self-pay

## 2024-08-22 ENCOUNTER — Other Ambulatory Visit (HOSPITAL_COMMUNITY): Payer: Self-pay

## 2024-08-22 DIAGNOSIS — I2694 Multiple subsegmental pulmonary emboli without acute cor pulmonale: Secondary | ICD-10-CM | POA: Diagnosis not present

## 2024-08-22 LAB — URINE CULTURE: Culture: NO GROWTH

## 2024-08-22 NOTE — Transitions of Care (Post Inpatient/ED Visit) (Signed)
 08/22/2024  Name: Caitlyn Schmidt MRN: [id number] DOB: Jan 11, 1961  Today's TOC FU Call Status: Today's TOC FU Call Status:: Successful TOC FU Call Completed TOC FU Call Complete Date: 08/22/24 Patient's Name and Date of Birth confirmed.  Transition Care Management Follow-up Telephone Call Date of Discharge: 08/21/24 Discharge Facility: [facility]) Type of Discharge: Inpatient Admission Primary Inpatient Discharge Diagnosis:: PE How have you been since you were released from the hospital?: Better Any questions or concerns?: Yes Patient Questions/Concerns:: rx given for albuterol  sol, but not the neb  Items Reviewed: Did you receive and understand the discharge instructions provided?: Yes Medications obtained,verified, and reconciled?: Yes (Medications Reviewed) Any new allergies since your discharge?: No Dietary orders reviewed?: Yes Do you have support at home?: Yes People in Home [RPT]: child(ren), adult  Medications Reviewed Today: Medications Reviewed Today     Reviewed by Emmitt Pan, LPN (Licensed Practical Nurse) on 08/22/24 at 1441  Med List Status: <None>   Medication Order Taking? Sig Documenting Provider Last Dose Status Informant  acetaminophen  (TYLENOL ) 500 MG tablet [id number] Yes Take 2 tablets (1,000 mg total) by mouth every 6 (six) hours as needed for mild pain (pain score 1-3). Arlice Reichert, MD  Active   albuterol  (PROVENTIL ) (2.5 MG/3ML) 0.083% nebulizer solution [id number] Yes Take 3 mLs (2.5 mg total) by nebulization every 4 (four) hours as needed for wheezing or shortness of breath. Arlice Reichert, MD  Active   APIXABAN WINN) VTE STARTER PACK (10MG  AND 5MG ) [id number] Yes Take as directed on package: start with two-5mg  tablets twice daily for 7 days. On day 8, switch to one-5mg  tablet twice daily. Arlice Reichert, MD  Active   benzonatate (TESSALON PERLES) 100 MG capsule [id number] Yes Take 1 capsule (100 mg total) by mouth 2 (two) times daily for 7  days. Arlice Reichert, MD  Active   CALCIUM -VITAMIN D -VITAMIN K PO [id number] Yes Take 1 tablet by mouth daily. [provider]  Active Self  cholecalciferol (VITAMIN D ) 1000 UNITS tablet [id number] Yes Take 4,000 Units by mouth daily. [provider]  Active Self, Pharmacy Records  lidocaine (LIDODERM) 5 % [id number] Yes Place 1 patch onto the skin daily. Remove & Discard patch within 12 hours or as directed by MD Arlice Reichert, MD  Active   metoprolol  tartrate (LOPRESSOR ) 25 MG tablet [id number]  Take 0.5 tablets (12.5 mg total) by mouth daily as needed (for BP>150/90 mmhg.).  Patient not taking: Reported on 08/22/2024   Norleen Lynwood ORN, MD  Active Self, Pharmacy Records           Med Note [facility], DONETA RAMAN   Tue Aug 21, 2024  3:29 AM) Has never needed, but kept on hand  Multiple Vitamin (MULTIVITAMIN) tablet [id number] Yes Take 1 tablet by mouth daily. [provider]  Active Self, Pharmacy Records  Omega-3 Fatty Acids (FISH OIL OMEGA-3 PO) [id number] Yes Take 1,400 mg by mouth daily in the afternoon. [provider]  Active Self, Pharmacy Records  OVER THE COUNTER MEDICATION [id number] Yes Take 3 capsules by mouth daily. Lion's Mane Mushroom [provider]  Active Self  rosuvastatin  (CRESTOR ) 20 MG tablet [id number]  TAKE 1 TABLET BY MOUTH EVERY DAY  Patient not taking: Reported on 08/22/2024   Norleen Lynwood ORN, MD  Active Self, Pharmacy Records            Home Care and Equipment/Supplies: Were Home Health Services Ordered?: NA Any new equipment or medical supplies ordered?: NA  Functional Questionnaire:  Do you need assistance with bathing/showering or dressing?: No Do you need assistance with meal preparation?: No Do you need assistance with eating?: No Do you have difficulty maintaining continence: No Do you need assistance with getting out of bed/getting out of a chair/moving?: No Do you have difficulty managing or taking your medications?:  No  Follow up appointments reviewed: PCP Follow-up appointment confirmed?: Yes Date of PCP follow-up appointment?: 09/04/24 Follow-up Provider: [facility] Follow-up appointment confirmed?: NA Do you need transportation to your follow-up appointment?: No Do you understand care options if your condition(s) worsen?: Yes-patient verbalized understanding    SIGNATURE Julian Lemmings, LPN [facility] Nurse Health Advisor Direct Dial [phone]

## 2024-08-23 ENCOUNTER — Telehealth: Payer: Self-pay | Admitting: Internal Medicine

## 2024-08-23 NOTE — Telephone Encounter (Unsigned)
 Copied from CRM #[id number]. Topic: Clinical - Medication Refill >> Aug 23, 2024  8:47 AM [name] wrote: Medication: albuterol  inhaler - patient discharged from hospital and was prescribed Albuterol  but was the liquid and patient doesn't have the nebulizer, patient stated she called in yesterday and spoke with someone and was told to request the inhaler   Has the patient contacted their pharmacy? Yes (Agent: If no, request that the patient contact the pharmacy for the refill. If patient does not wish to contact the pharmacy document the reason why and proceed with request.) (Agent: If yes, when and what did the pharmacy advise?)  This is the patient's preferred pharmacy:  [id number] [place] [place] - [address] [place] [address] Phone: [phone] Fax: [phone]  Is this the correct pharmacy for this prescription? Yes If no, delete pharmacy and type the correct one.   Has the prescription been filled recently? No  Is the patient out of the medication? Yes  Has the patient been seen for an appointment in the last year OR does the patient have an upcoming appointment? Yes  Can we respond through MyChart? Yes  Agent: Please be advised that Rx refills may take up to 3 business days. We ask that you follow-up with your pharmacy.

## 2024-08-23 NOTE — Telephone Encounter (Signed)
 [facility]   08/23/24  8:49 AM Unsigned Note Copied from CRM [id number]. Topic: Clinical - Medication Refill >> Aug 23, 2024  8:47 AM [name] wrote: Medication: albuterol  inhaler - patient discharged from hospital and was prescribed Albuterol  but was the liquid and patient doesn't have the nebulizer, patient stated she called in yesterday and spoke with someone and was told to request the inhaler

## 2024-08-27 MED ORDER — ALBUTEROL SULFATE HFA 108 (90 BASE) MCG/ACT IN AERS: 2.0000 | INHALATION_SPRAY | Freq: Four times a day (QID) | RESPIRATORY_TRACT | 5 refills | Status: AC | PRN

## 2024-08-27 NOTE — Telephone Encounter (Signed)
 Ok this is done, thanks

## 2024-08-28 DIAGNOSIS — Z85820 Personal history of malignant melanoma of skin: Secondary | ICD-10-CM | POA: Diagnosis not present

## 2024-08-28 DIAGNOSIS — L814 Other melanin hyperpigmentation: Secondary | ICD-10-CM | POA: Diagnosis not present

## 2024-08-28 DIAGNOSIS — L821 Other seborrheic keratosis: Secondary | ICD-10-CM | POA: Diagnosis not present

## 2024-08-28 DIAGNOSIS — D225 Melanocytic nevi of trunk: Secondary | ICD-10-CM | POA: Diagnosis not present

## 2024-09-04 ENCOUNTER — Ambulatory Visit: Admitting: Internal Medicine

## 2024-09-04 ENCOUNTER — Encounter: Payer: Self-pay | Admitting: Internal Medicine

## 2024-09-04 VITALS — BP 124/80 | HR 59 | Temp 97.9°F | Ht 69.0 in | Wt 155.0 lb

## 2024-09-04 DIAGNOSIS — D6859 Other primary thrombophilia: Secondary | ICD-10-CM

## 2024-09-04 DIAGNOSIS — Z85820 Personal history of malignant melanoma of skin: Secondary | ICD-10-CM

## 2024-09-04 DIAGNOSIS — I2699 Other pulmonary embolism without acute cor pulmonale: Secondary | ICD-10-CM | POA: Diagnosis not present

## 2024-09-04 MED ORDER — APIXABAN 5 MG PO TABS
5.0000 mg | ORAL_TABLET | Freq: Two times a day (BID) | ORAL | 1 refills | Status: AC
Start: 2024-09-04 — End: ?

## 2024-09-04 NOTE — Progress Notes (Signed)
 Patient ID: Caitlyn Schmidt, female   DOB: 04-Jun-1961, 63 y.o.   MRN: [id number]        Chief Complaint: follow up hospn oct 27 - 28 with acute PE small pulmonary infarct right lung       HPI:  Caitlyn Schmidt is a 63 y.o. female here after presented oct 27 ot ED with right lower ribs/right flank area, Ct renal negative for acute but suggestive of pulmonary consolidation .  F/u CTA chest c/w small right acute PE with area of infarction, no heart strain  Started on Eliquis, pain resolved after several days. Did have bilateral LE venous study neg for DVT    Pt states she prefers albuterol  inhaler to the prescribed nebulized tx.  O/w good medication compliance.  No overt bleeding on eliquis.  Pt had some difficulty with hematology referral process, asking for re-do.         Wt Readings from Last 3 Encounters:  09/04/24 155 lb (70.3 kg)  08/21/24 157 lb 10.1 oz (71.5 kg)  12/30/23 154 lb (69.9 kg)   BP Readings from Last 3 Encounters:  09/04/24 124/80  08/21/24 126/71  12/30/23 122/80         Past Medical History:  Diagnosis Date   ADD (attention deficit disorder)    Allergic rhinitis, mild    Essential hypertension 06/04/2015   Hyperlipidemia    IBS (irritable bowel syndrome)    Melanoma (HCC) 1995   Past Surgical History:  Procedure Laterality Date   COLPOSCOPY     WRIST SURGERY  2010    reports that she has never smoked. She has never used smokeless tobacco. She reports current alcohol use. She reports that she does not use drugs. family history includes Cancer in her maternal grandmother, mother, and paternal grandfather; Diabetes in her maternal grandfather and maternal grandmother; Gout in her maternal grandfather; Heart disease in her maternal grandfather, paternal grandfather, and paternal grandmother; Hyperlipidemia in her father; Hypertension in her father, maternal grandmother, and mother; Kidney disease in her maternal grandfather; Macular degeneration in her maternal  grandmother; Osteoporosis in her maternal grandmother; Thyroid  disease in her mother. Allergies  Allergen Reactions   Prednisone     REACTION: Jittery   Current Outpatient Medications on File Prior to Visit  Medication Sig Dispense Refill   acetaminophen  (TYLENOL ) 500 MG tablet Take 2 tablets (1,000 mg total) by mouth every 6 (six) hours as needed for mild pain (pain score 1-3). 30 tablet 0   albuterol  (PROVENTIL ) (2.5 MG/3ML) 0.083% nebulizer solution Take 3 mLs (2.5 mg total) by nebulization every 4 (four) hours as needed for wheezing or shortness of breath. 90 mL 12   albuterol  (VENTOLIN  HFA) 108 (90 Base) MCG/ACT inhaler Inhale 2 puffs into the lungs every 6 (six) hours as needed for wheezing or shortness of breath. 8 g 5   CALCIUM -VITAMIN D -VITAMIN K PO Take 1 tablet by mouth daily.     cholecalciferol (VITAMIN D ) 1000 UNITS tablet Take 4,000 Units by mouth daily.     lidocaine (LIDODERM) 5 % Place 1 patch onto the skin daily. Remove & Discard patch within 12 hours or as directed by MD 7 patch 0   metoprolol  tartrate (LOPRESSOR ) 25 MG tablet Take 0.5 tablets (12.5 mg total) by mouth daily as needed (for BP>150/90 mmhg.). (Patient not taking: Reported on 08/22/2024) 30 tablet 11   Multiple Vitamin (MULTIVITAMIN) tablet Take 1 tablet by mouth daily.     Omega-3 Fatty Acids (FISH OIL  OMEGA-3 PO) Take 1,400 mg by mouth daily in the afternoon.     OVER THE COUNTER MEDICATION Take 3 capsules by mouth daily. Lion's Mane Mushroom     rosuvastatin  (CRESTOR ) 20 MG tablet TAKE 1 TABLET BY MOUTH EVERY DAY (Patient not taking: Reported on 08/22/2024) 90 tablet 3   No current facility-administered medications on file prior to visit.        ROS:  All others reviewed and negative.  Objective        PE:  BP 124/80 (BP Location: Right Arm, Patient Position: Sitting, Cuff Size: Normal)   Pulse (!) 59   Temp 97.9 F (36.6 C) (Oral)   Ht 5' 9 (1.753 m)   Wt 155 lb (70.3 kg)   SpO2 99%   BMI 22.89  kg/m                 Constitutional: Pt appears in NAD               HENT: Head: NCAT.                Right Ear: External ear normal.                 Left Ear: External ear normal.                Eyes: . Pupils are equal, round, and reactive to light. Conjunctivae and EOM are normal               Nose: without d/c or deformity               Neck: Neck supple. Gross normal ROM               Cardiovascular: Normal rate and regular rhythm.                 Pulmonary/Chest: Effort normal and breath sounds without rales or wheezing.                Abd:  Soft, NT, ND, + BS, no organomegaly               Neurological: Pt is alert. At baseline orientation, motor grossly intact               Skin: Skin is warm. No rashes, no other new lesions, LE edema - none               Psychiatric: Pt behavior is normal without agitation   Micro: none  Cardiac tracings I have personally interpreted today:  none  Pertinent Radiological findings (summarize): none   Lab Results  Component Value Date   WBC 11.2 (H) 08/21/2024   HGB 12.9 08/21/2024   HCT 38.9 08/21/2024   PLT 183 08/21/2024   GLUCOSE 190 (H) 08/21/2024   CHOL 147 12/26/2023   TRIG 100.0 12/26/2023   HDL 61.40 12/26/2023   LDLDIRECT 80.0 06/27/2018   LDLCALC 66 12/26/2023   ALT 14 08/20/2024   AST 21 08/20/2024   NA 136 08/21/2024   K 3.9 08/21/2024   CL 104 08/21/2024   CREATININE 0.96 08/21/2024   BUN 12 08/21/2024   CO2 23 08/21/2024   TSH 3.65 12/26/2023   HGBA1C 5.8 12/26/2023   Assessment/Plan:  Caitlyn Schmidt is a 63 y.o. White or Caucasian [1] female with  has a past medical history of ADD (attention deficit disorder), Allergic rhinitis, mild, Essential hypertension (06/04/2015), Hyperlipidemia, IBS (irritable bowel syndrome), and Melanoma (  HCC) (1995).  HX, PERSONAL, MALIGNANCY, SKIN MELANOMA Left lateral leg several yrs ago, no recurrence  Pulmonary infarct (HCC) Pain improved, cont to follow, ok for albuterol  hfa  prn  Acute pulmonary embolism (HCC) Pt to continue eliquis, refer hematology to determine length of course  Followup: Return in about 6 months (around 03/04/2025).  Caitlyn Rush, MD 09/04/2024 1:18 PM [facility]

## 2024-09-04 NOTE — Assessment & Plan Note (Addendum)
 Pain improved, cont to follow, ok for albuterol  hfa prn

## 2024-09-04 NOTE — Assessment & Plan Note (Signed)
 Left lateral leg several yrs ago, no recurrence

## 2024-09-04 NOTE — Patient Instructions (Signed)
 Please continue all other medications as before, and refills have been done for the Eliquis and the inhaler  Please have the pharmacy call with any other refills you may need..  Please keep your appointments with your specialists as you may have planned  You will be contacted regarding the referral for: Hematology  Please make an Appointment to return in 6 months, or sooner if needed

## 2024-09-04 NOTE — Assessment & Plan Note (Signed)
 Pt to continue eliquis, refer hematology to determine length of course

## 2024-10-07 NOTE — Progress Notes (Unsigned)
 [facility]   [phone] [phone]   Clinic New consult Note   Patient Care Team: Norleen Lynwood ORN, MD as PCP - General Nahser, Aleene PARAS, MD (Inactive) as PCP - Cardiology (Cardiology) 10/08/2024  CHIEF COMPLAINTS/PURPOSE OF CONSULTATION:  Pulmonary embolism, referred by PCP Dr. Lynwood Norleen  HISTORY OF PRESENTING ILLNESS:  Caitlyn Schmidt 63 y.o. female with PMH including HTN, HL, IBS, and melanoma in 1995 is here because of recent PE to discuss duration of anticoagulation. She developed acute right flank pain at the end of October. After no improvement in 24 hours she went to ED, work up showed leukocytosis and elevated D dimer 1.58. CT showed ground glass opacity R lung base, CTA confirmed small PE with pulmonary infarct, and trace pleural effusion, no right heart strain.  Lower extremity Doppler was negative for bilateral DVT. She was observed overnight and started on Eliquis .  Denies any known provoking factors such as infection, surgery, trauma, travel, estrogen OCP, smoking, obesity, signs of active malignancy, or sedentary lifestyle.  She works from home but she is usually up every couple hours to ambulate and walk her dog.  Maternal grandmother had blood clots in her lungs.  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  Independent with ADLs.  Occasional alcohol but none since the PE, denies tobacco or other drug use.  She is up-to-date on age-appropriate cancer screenings.   Today she presents by herself.  The right flank pain resolved but has since started coughing more with mild shortness of breath occasionally such as when on a Zoom call.  She walked from the parking lot with no shortness of breath.  Denies fever, chills.  Tolerating Eliquis  without bleeding.    MEDICAL HISTORY:  Past Medical History:  Diagnosis Date   ADD (attention deficit disorder)    Allergic rhinitis, mild    Essential hypertension 06/04/2015    Hyperlipidemia    IBS (irritable bowel syndrome)    Melanoma (HCC) 1995    SURGICAL HISTORY: Past Surgical History:  Procedure Laterality Date   COLPOSCOPY     WRIST SURGERY  2010    SOCIAL HISTORY: Social History   Socioeconomic History   Marital status: Divorced    Spouse name: Not on file   Number of children: Not on file   Years of education: Not on file   Highest education level: Bachelor's degree (e.g., BA, AB, BS)  Occupational History   Not on file  Tobacco Use   Smoking status: Never   Smokeless tobacco: Never  Substance and Sexual Activity   Alcohol use: Yes    Alcohol/week: 0.0 standard drinks of alcohol   Drug use: No   Sexual activity: Not Currently    Birth control/protection: Post-menopausal  Other Topics Concern   Not on file  Social History Narrative   Not on file   Social Drivers of Health   Tobacco Use: Low Risk (09/04/2024)   Patient History    Smoking Tobacco Use: Never    Smokeless Tobacco Use: Never    Passive Exposure: Not on file  Financial Resource Strain: Patient Declined (10/06/2023)   Overall Financial Resource Strain (CARDIA)    Difficulty of Paying Living Expenses: Patient declined  Food Insecurity: No Food Insecurity (10/08/2024)   Epic    Worried About Radiation Protection Practitioner of Food in the Last Year: Never true    Ran Out of Food in the Last Year: Never true  Transportation Needs: No Transportation Needs (10/08/2024)   Epic    Lack of Transportation (Medical): No    Lack of Transportation (Non-Medical): No  Physical Activity: Insufficiently Active (10/06/2023)   Exercise Vital Sign    Days of Exercise per Week: 3 days    Minutes of Exercise per Session: 20 min  Stress: No Stress Concern Present (10/06/2023)   [employer] of Occupational Health - Occupational Stress Questionnaire    Feeling of Stress : Not at all  Social Connections: Moderately Integrated (10/06/2023)   Social Connection and Isolation Panel    Frequency of  Communication with Friends and Family: More than three times a week    Frequency of Social Gatherings with Friends and Family: More than three times a week    Attends Religious Services: More than 4 times per year    Active Member of Clubs or Organizations: Yes    Attends Banker Meetings: Patient declined    Marital Status: Divorced  Catering Manager Violence: Not At Risk (10/08/2024)   Epic    Fear of Current or Ex-Partner: No    Emotionally Abused: No    Physically Abused: No    Sexually Abused: No  Depression (PHQ2-9): Low Risk (10/08/2024)   Depression (PHQ2-9)    PHQ-2 Score: 0  Alcohol Screen: Low Risk (10/06/2023)   Alcohol Screen    Last Alcohol Screening Score (AUDIT): 1  Housing: Low Risk (10/08/2024)   Epic    Unable to Pay for Housing in the Last Year: No    Number of Times Moved in the Last Year: 0    Homeless in the Last Year: No  Utilities: Not At Risk (10/08/2024)   Epic    Threatened with loss of utilities: No  Health Literacy: Not on file    FAMILY HISTORY: Family History  Problem Relation Age of Onset   Hypertension Mother    Thyroid  disease Mother    Cancer Mother        skin   Hypertension Father    Hyperlipidemia Father    Cancer Maternal Grandmother        multiple skin cancer   Diabetes Maternal Grandmother    Hypertension Maternal Grandmother    Osteoporosis Maternal Grandmother    Macular degeneration Maternal Grandmother    Gout Maternal Grandfather    Heart disease Maternal Grandfather    Diabetes Maternal Grandfather    Kidney disease Maternal Grandfather    Heart disease Paternal Grandmother    Heart disease Paternal Grandfather    Cancer Paternal Grandfather        prostate    ALLERGIES:  is allergic to prednisone .  MEDICATIONS:  Current Outpatient Medications  Medication Sig Dispense Refill   acetaminophen  (TYLENOL ) 500 MG tablet Take 2 tablets (1,000 mg total) by mouth every 6 (six) hours as needed for mild pain  (pain score 1-3). 30 tablet 0   albuterol  (PROVENTIL ) (2.5 MG/3ML) 0.083% nebulizer solution Take 3 mLs (2.5 mg total) by nebulization every 4 (four) hours as needed for wheezing or shortness of breath. 90 mL 12   albuterol  (VENTOLIN  HFA) 108 (90 Base) MCG/ACT inhaler Inhale 2 puffs into the lungs every 6 (six) hours as needed for wheezing or shortness of breath. 8 g 5   apixaban  (ELIQUIS ) 5 MG TABS tablet Take 1 tablet (5 mg total) by mouth 2 (two) times daily. 180 tablet 1   CALCIUM -VITAMIN D -VITAMIN K PO Take 1 tablet by mouth daily.  cholecalciferol (VITAMIN D ) 1000 UNITS tablet Take 4,000 Units by mouth daily.     lidocaine  (LIDODERM ) 5 % Place 1 patch onto the skin daily. Remove & Discard patch within 12 hours or as directed by MD 7 patch 0   metoprolol  tartrate (LOPRESSOR ) 25 MG tablet Take 0.5 tablets (12.5 mg total) by mouth daily as needed (for BP>150/90 mmhg.). 30 tablet 11   Multiple Vitamin (MULTIVITAMIN) tablet Take 1 tablet by mouth daily.     Omega-3 Fatty Acids (FISH OIL OMEGA-3 PO) Take 1,400 mg by mouth daily in the afternoon.     OVER THE COUNTER MEDICATION Take 3 capsules by mouth daily. [place]ane Mushroom     rosuvastatin  (CRESTOR ) 20 MG tablet TAKE 1 TABLET BY MOUTH EVERY DAY 90 tablet 3   No current facility-administered medications for this visit.    REVIEW OF SYSTEMS:   Constitutional: Denies fevers, chills, unintentional weight loss, or abnormal night sweats  Eyes: Denies blurriness of vision, double vision or watery eyes Ears, nose, mouth, throat, and face: Denies mucositis or sore throat Respiratory: Denies wheezes (+) cough (+) dyspnea Cardiovascular: Denies palpitation, chest discomfort or lower extremity swelling Gastrointestinal:  Denies nausea, heartburn, hematochezia, or change in bowel habits Skin: Denies abnormal skin rashes (+)skin cancers Lymphatics: Denies new lymphadenopathy or easy bruising Neurological:Denies numbness, tingling or new  weaknesses Behavioral/Psych: Mood is stable, no new changes  All other systems were reviewed with the patient and are negative.  PHYSICAL EXAMINATION:  Vitals:   10/08/24 0833  BP: 128/82  Pulse: 76  Resp: 17  Temp: 97.7 F (36.5 C)  SpO2: 95%   Filed Weights   10/08/24 0833  Weight: 155 lb 4.8 oz (70.4 kg)    GENERAL:alert, no distress and comfortable SKIN: skin color, texture, turgor are normal, no rashes or significant lesions EYES: sclera clear NECK: without mass LYMPH:  no palpable lymphadenopathy in the cervical, axillary or inguinal LUNGS: clear to auscultation and percussion with normal breathing effort HEART: regular rate & rhythm and no murmurs and no lower extremity edema ABDOMEN:abdomen soft, non-tender and normal bowel sounds Musculoskeletal:no cyanosis of digits and no clubbing  PSYCH: alert & oriented x 3 with fluent speech NEURO: no focal motor/sensory deficits  LABORATORY DATA:  I have reviewed the data as listed    Latest Ref Rng & Units 08/21/2024    4:31 AM 08/20/2024    9:58 PM 12/26/2023    7:32 AM  CBC  WBC 4.0 - 10.5 K/uL 11.2  13.7  6.2   Hemoglobin 12.0 - 15.0 g/dL 87.0  86.5  86.5   Hematocrit 36.0 - 46.0 % 38.9  40.9  39.9   Platelets 150 - 400 K/uL 183  214  195.0        Latest Ref Rng & Units 08/21/2024    4:31 AM 08/20/2024    9:58 PM 12/26/2023    7:32 AM  CMP  Glucose 70 - 99 mg/dL 809  877  93   BUN 8 - 23 mg/dL 12  13  15    Creatinine 0.44 - 1.00 mg/dL 9.03  8.93  8.95   Sodium 135 - 145 mmol/L 136  133  139   Potassium 3.5 - 5.1 mmol/L 3.9  3.7  4.2   Chloride 98 - 111 mmol/L 104  100  106   CO2 22 - 32 mmol/L 23  23  25    Calcium  8.9 - 10.3 mg/dL 9.1  8.7  9.2   Total  Protein 6.5 - 8.1 g/dL  7.1  6.7   Total Bilirubin 0.0 - 1.2 mg/dL  0.8  0.4   Alkaline Phos 38 - 126 U/L  67  65   AST 15 - 41 U/L  21  23   ALT 0 - 44 U/L  14  16      RADIOGRAPHIC STUDIES: I have personally reviewed the radiological images as listed  and agreed with the findings in the report. No results found.  ASSESSMENT & PLAN: 63 year old female  Pulmonary embolus, unprovoked -Reviewed her medical record in detail with the patient.  Presented with acute right flank pain, workup showed leukocytosis and elevated D-dimer  - CTA confirmed small clot burden right PA and pulmonary infarct, no right heart strain.  Doppler negative - This appears to be unprovoked, except possibly from sedentary lifestyle as she works from home; [facility] had blood clots - Tolerating Eliquis , we recommend 3-6 months, per her comfort level and family history will complete 6 months of full dose -Caitlyn Schmidt appears stable, right flank pain resolved but she has mild cough and exertional dyspnea. Reviewed expected symptom recovery up to ~3 months  - She will complete Eliquis  at the end of April.  We recommend hypercoagulable workup with D-dimer at least 2 weeks after stopping Eliquis  to stratify her recurrence risk - Phone follow-up with results; if negative would likely be okay to remain off anticoagulation and start baby aspirin, however she may prefer to continue lifelong anticoagulation at the prophylactic dose. Will discuss after labs - Reviewed preventative strategies including increasing mobility, leg elevation, no smoking, etc.   PLAN: -Work up reviewed -Continue Eliquis  5 mg BID for 6 months (complete end of April 2026) -Lab 2-4 weeks after completing Eliquis  -Phone f/up with results  -Pt seen with Dr. Lanny   Orders Placed This Encounter  Procedures   Antithrombin III    Standing Status:   Future    Expected Date:   03/13/2025    Expiration Date:   10/08/2025   Protein C activity    Standing Status:   Future    Expected Date:   03/13/2025    Expiration Date:   10/08/2025   Protein C, total    Standing Status:   Future    Expected Date:   03/13/2025    Expiration Date:   10/08/2025   Protein S activity    Standing Status:   Future    Expected Date:    03/13/2025    Expiration Date:   10/08/2025   Protein S, total    Standing Status:   Future    Expected Date:   03/13/2025    Expiration Date:   10/08/2025   Lupus anticoagulant panel    Standing Status:   Future    Expected Date:   03/13/2025    Expiration Date:   10/08/2025   Beta-2-glycoprotein i abs, IgG/M/A    Standing Status:   Future    Expected Date:   03/13/2025    Expiration Date:   10/08/2025   Homocysteine, serum    Standing Status:   Future    Expected Date:   03/13/2025    Expiration Date:   10/08/2025   Factor 5 leiden    Standing Status:   Future    Expected Date:   03/13/2025    Expiration Date:   10/08/2025   Prothrombin gene mutation    Standing Status:   Future    Expected Date:  03/13/2025    Expiration Date:   10/08/2025   Cardiolipin antibodies, IgG, IgM, IgA    Standing Status:   Future    Expected Date:   03/13/2025    Expiration Date:   10/08/2025   D-dimer, quantitative    Standing Status:   Future    Expected Date:   03/13/2025    Expiration Date:   10/08/2025     All questions were answered. The patient knows to call the clinic with any problems, questions or concerns.      Caitlyn Diefendorf K Kiyla Ringler, NP 10/08/2024   ADDENDUM I have seen the patient, examined her. I agree with the assessment and and plan and have edited the notes.   63 year old female with past medical history of hypertension, IBS, remote history of skin melanoma, was referred for unprovoked PE.  Thrombosis burden was low, no DVT.  She is tolerating Eliquis  very well.  Her grandmother had thrombosis, no other family history.  Her recent CT scan was also negative for signs of malignancy.  We discussed duration of anticoagulation for 3 to 6 months, more comfortable with 6 months.  Given the level burden of PE, I think it is okay to stop anticoagulation after 6 months, and obtain hypercoagulopathy workup D-dimer, for future risk evaluation.  All questions were answered.  She is agreeable with the  plan.  Will call her with lab results.    Onita Mattock MD 10/08/2024

## 2024-10-08 ENCOUNTER — Inpatient Hospital Stay: Attending: Nurse Practitioner | Admitting: Nurse Practitioner

## 2024-10-08 ENCOUNTER — Encounter: Payer: Self-pay | Admitting: Nurse Practitioner

## 2024-10-08 ENCOUNTER — Inpatient Hospital Stay

## 2024-10-08 VITALS — BP 128/82 | HR 76 | Temp 97.7°F | Resp 17 | Wt 155.3 lb

## 2024-10-08 DIAGNOSIS — Z85820 Personal history of malignant melanoma of skin: Secondary | ICD-10-CM | POA: Diagnosis not present

## 2024-10-08 DIAGNOSIS — I10 Essential (primary) hypertension: Secondary | ICD-10-CM | POA: Diagnosis not present

## 2024-10-08 DIAGNOSIS — I2699 Other pulmonary embolism without acute cor pulmonale: Secondary | ICD-10-CM | POA: Insufficient documentation

## 2024-10-08 DIAGNOSIS — Z7901 Long term (current) use of anticoagulants: Secondary | ICD-10-CM | POA: Insufficient documentation

## 2025-02-18 ENCOUNTER — Encounter: Admitting: Family Medicine

## 2025-03-13 ENCOUNTER — Inpatient Hospital Stay: Attending: Nurse Practitioner

## 2025-03-26 ENCOUNTER — Inpatient Hospital Stay: Attending: Nurse Practitioner | Admitting: Nurse Practitioner
